# Patient Record
Sex: Female | Born: 1935 | Race: White | Hispanic: No | Smoking: Former smoker
Health system: Southern US, Community
[De-identification: ages and names within clinical notes are randomized; demographics above are authoritative.]

## PROBLEM LIST (undated history)

## (undated) DIAGNOSIS — J45909 Unspecified asthma, uncomplicated: Secondary | ICD-10-CM

## (undated) DIAGNOSIS — K8020 Calculus of gallbladder without cholecystitis without obstruction: Secondary | ICD-10-CM

## (undated) DIAGNOSIS — C569 Malignant neoplasm of unspecified ovary: Secondary | ICD-10-CM

## (undated) DIAGNOSIS — I10 Essential (primary) hypertension: Secondary | ICD-10-CM

## (undated) DIAGNOSIS — I2510 Atherosclerotic heart disease of native coronary artery without angina pectoris: Secondary | ICD-10-CM

## (undated) DIAGNOSIS — E785 Hyperlipidemia, unspecified: Secondary | ICD-10-CM

## (undated) DIAGNOSIS — F17201 Nicotine dependence, unspecified, in remission: Secondary | ICD-10-CM

## (undated) DIAGNOSIS — D649 Anemia, unspecified: Secondary | ICD-10-CM

## (undated) DIAGNOSIS — M797 Fibromyalgia: Secondary | ICD-10-CM

## (undated) DIAGNOSIS — Z8739 Personal history of other diseases of the musculoskeletal system and connective tissue: Secondary | ICD-10-CM

## (undated) DIAGNOSIS — M069 Rheumatoid arthritis, unspecified: Secondary | ICD-10-CM

## (undated) DIAGNOSIS — I4891 Unspecified atrial fibrillation: Secondary | ICD-10-CM

## (undated) DIAGNOSIS — C189 Malignant neoplasm of colon, unspecified: Secondary | ICD-10-CM

## (undated) HISTORY — DX: Hyperlipidemia, unspecified: E78.5

## (undated) HISTORY — DX: Unspecified atrial fibrillation: I48.91

## (undated) HISTORY — PX: OTHER SURGICAL HISTORY: SHX169

## (undated) HISTORY — DX: Malignant neoplasm of unspecified ovary: C56.9

## (undated) HISTORY — DX: Calculus of gallbladder without cholecystitis without obstruction: K80.20

## (undated) HISTORY — PX: TONSILLECTOMY: SUR1361

## (undated) HISTORY — DX: Essential (primary) hypertension: I10

## (undated) HISTORY — DX: Rheumatoid arthritis, unspecified: M06.9

## (undated) HISTORY — DX: Nicotine dependence, unspecified, in remission: F17.201

## (undated) HISTORY — DX: Unspecified asthma, uncomplicated: J45.909

## (undated) HISTORY — DX: Personal history of other diseases of the musculoskeletal system and connective tissue: Z87.39

## (undated) HISTORY — PX: APPENDECTOMY: SHX54

## (undated) HISTORY — DX: Malignant neoplasm of colon, unspecified: C18.9

## (undated) HISTORY — DX: Atherosclerotic heart disease of native coronary artery without angina pectoris: I25.10

---

## 1998-09-19 ENCOUNTER — Ambulatory Visit (HOSPITAL_COMMUNITY): Admission: RE | Admit: 1998-09-19 | Discharge: 1998-09-19 | Payer: Self-pay

## 1999-09-07 HISTORY — PX: PARTIAL COLECTOMY: SHX5273

## 1999-10-09 ENCOUNTER — Ambulatory Visit (HOSPITAL_COMMUNITY): Admission: RE | Admit: 1999-10-09 | Discharge: 1999-10-09 | Payer: Self-pay

## 2000-09-05 ENCOUNTER — Encounter: Admission: RE | Admit: 2000-09-05 | Discharge: 2000-09-05 | Payer: Self-pay | Admitting: General Surgery

## 2000-11-11 ENCOUNTER — Ambulatory Visit (HOSPITAL_COMMUNITY): Admission: RE | Admit: 2000-11-11 | Discharge: 2000-11-11 | Payer: Self-pay | Admitting: Obstetrics & Gynecology

## 2000-11-15 ENCOUNTER — Encounter: Admission: RE | Admit: 2000-11-15 | Discharge: 2000-11-15 | Payer: Self-pay | Admitting: Oncology

## 2000-12-12 ENCOUNTER — Encounter: Admission: RE | Admit: 2000-12-12 | Discharge: 2000-12-12 | Payer: Self-pay | Admitting: Oncology

## 2000-12-12 ENCOUNTER — Encounter (HOSPITAL_COMMUNITY): Admission: RE | Admit: 2000-12-12 | Discharge: 2001-01-11 | Payer: Self-pay | Admitting: Oncology

## 2001-01-11 ENCOUNTER — Encounter (HOSPITAL_COMMUNITY): Admission: RE | Admit: 2001-01-11 | Discharge: 2001-02-10 | Payer: Self-pay | Admitting: Oncology

## 2001-01-11 ENCOUNTER — Encounter: Admission: RE | Admit: 2001-01-11 | Discharge: 2001-01-11 | Payer: Self-pay | Admitting: Oncology

## 2001-02-20 ENCOUNTER — Encounter (HOSPITAL_COMMUNITY): Admission: RE | Admit: 2001-02-20 | Discharge: 2001-03-22 | Payer: Self-pay | Admitting: Oncology

## 2001-02-20 ENCOUNTER — Encounter: Admission: RE | Admit: 2001-02-20 | Discharge: 2001-02-20 | Payer: Self-pay | Admitting: Oncology

## 2001-05-30 ENCOUNTER — Encounter (HOSPITAL_COMMUNITY): Admission: RE | Admit: 2001-05-30 | Discharge: 2001-06-29 | Payer: Self-pay | Admitting: Oncology

## 2001-05-30 ENCOUNTER — Encounter: Admission: RE | Admit: 2001-05-30 | Discharge: 2001-05-30 | Payer: Self-pay | Admitting: Oncology

## 2001-06-12 ENCOUNTER — Ambulatory Visit (HOSPITAL_COMMUNITY): Admission: RE | Admit: 2001-06-12 | Discharge: 2001-06-12 | Payer: Self-pay | Admitting: General Surgery

## 2001-09-06 HISTORY — PX: TOTAL ABDOMINAL HYSTERECTOMY W/ BILATERAL SALPINGOOPHORECTOMY: SHX83

## 2001-09-28 ENCOUNTER — Ambulatory Visit (HOSPITAL_COMMUNITY): Admission: RE | Admit: 2001-09-28 | Discharge: 2001-09-28 | Payer: Self-pay | Admitting: Obstetrics and Gynecology

## 2001-10-16 ENCOUNTER — Inpatient Hospital Stay (HOSPITAL_COMMUNITY): Admission: AD | Admit: 2001-10-16 | Discharge: 2001-10-26 | Payer: Self-pay | Admitting: Obstetrics and Gynecology

## 2001-10-23 ENCOUNTER — Encounter: Payer: Self-pay | Admitting: General Surgery

## 2001-10-31 ENCOUNTER — Other Ambulatory Visit: Admission: RE | Admit: 2001-10-31 | Discharge: 2001-10-31 | Payer: Self-pay | Admitting: Oncology

## 2001-10-31 ENCOUNTER — Encounter (INDEPENDENT_AMBULATORY_CARE_PROVIDER_SITE_OTHER): Payer: Self-pay

## 2001-11-14 ENCOUNTER — Encounter (HOSPITAL_COMMUNITY): Admission: RE | Admit: 2001-11-14 | Discharge: 2001-12-14 | Payer: Self-pay | Admitting: Oncology

## 2001-11-14 ENCOUNTER — Encounter: Admission: RE | Admit: 2001-11-14 | Discharge: 2001-11-14 | Payer: Self-pay | Admitting: Oncology

## 2001-11-16 ENCOUNTER — Encounter (HOSPITAL_COMMUNITY): Payer: Self-pay | Admitting: Oncology

## 2001-12-18 ENCOUNTER — Encounter (HOSPITAL_COMMUNITY): Admission: RE | Admit: 2001-12-18 | Discharge: 2002-01-17 | Payer: Self-pay | Admitting: Oncology

## 2002-01-29 ENCOUNTER — Encounter: Admission: RE | Admit: 2002-01-29 | Discharge: 2002-01-29 | Payer: Self-pay | Admitting: Oncology

## 2002-01-29 ENCOUNTER — Encounter (HOSPITAL_COMMUNITY): Admission: RE | Admit: 2002-01-29 | Discharge: 2002-02-28 | Payer: Self-pay | Admitting: Oncology

## 2002-03-06 ENCOUNTER — Encounter (HOSPITAL_COMMUNITY): Admission: RE | Admit: 2002-03-06 | Discharge: 2002-04-05 | Payer: Self-pay | Admitting: Oncology

## 2002-03-06 ENCOUNTER — Encounter: Admission: RE | Admit: 2002-03-06 | Discharge: 2002-03-06 | Payer: Self-pay | Admitting: Oncology

## 2002-04-06 ENCOUNTER — Encounter: Admission: RE | Admit: 2002-04-06 | Discharge: 2002-04-06 | Payer: Self-pay | Admitting: Oncology

## 2002-04-06 ENCOUNTER — Encounter (HOSPITAL_COMMUNITY): Admission: RE | Admit: 2002-04-06 | Discharge: 2002-05-06 | Payer: Self-pay | Admitting: Oncology

## 2002-04-09 ENCOUNTER — Ambulatory Visit (HOSPITAL_COMMUNITY): Admission: RE | Admit: 2002-04-09 | Discharge: 2002-04-09 | Payer: Self-pay | Admitting: Obstetrics and Gynecology

## 2002-04-09 ENCOUNTER — Encounter: Payer: Self-pay | Admitting: Obstetrics and Gynecology

## 2002-04-11 ENCOUNTER — Encounter (HOSPITAL_COMMUNITY): Payer: Self-pay | Admitting: Oncology

## 2002-05-16 ENCOUNTER — Encounter (HOSPITAL_COMMUNITY): Admission: RE | Admit: 2002-05-16 | Discharge: 2002-06-15 | Payer: Self-pay | Admitting: Oncology

## 2002-05-16 ENCOUNTER — Encounter: Admission: RE | Admit: 2002-05-16 | Discharge: 2002-05-16 | Payer: Self-pay | Admitting: Oncology

## 2002-06-04 ENCOUNTER — Encounter: Payer: Self-pay | Admitting: Family Medicine

## 2002-06-04 ENCOUNTER — Ambulatory Visit (HOSPITAL_COMMUNITY): Admission: RE | Admit: 2002-06-04 | Discharge: 2002-06-04 | Payer: Self-pay | Admitting: Family Medicine

## 2002-07-11 ENCOUNTER — Encounter: Admission: RE | Admit: 2002-07-11 | Discharge: 2002-07-11 | Payer: Self-pay | Admitting: Oncology

## 2002-08-23 ENCOUNTER — Encounter: Admission: RE | Admit: 2002-08-23 | Discharge: 2002-08-23 | Payer: Self-pay | Admitting: Oncology

## 2002-10-05 ENCOUNTER — Encounter: Admission: RE | Admit: 2002-10-05 | Discharge: 2002-10-05 | Payer: Self-pay | Admitting: Oncology

## 2002-10-05 ENCOUNTER — Encounter (HOSPITAL_COMMUNITY): Admission: RE | Admit: 2002-10-05 | Discharge: 2002-11-04 | Payer: Self-pay | Admitting: Oncology

## 2002-11-07 ENCOUNTER — Ambulatory Visit (HOSPITAL_COMMUNITY): Admission: RE | Admit: 2002-11-07 | Discharge: 2002-11-07 | Payer: Self-pay | Admitting: *Deleted

## 2002-12-31 ENCOUNTER — Encounter: Admission: RE | Admit: 2002-12-31 | Discharge: 2002-12-31 | Payer: Self-pay | Admitting: Oncology

## 2003-02-15 ENCOUNTER — Encounter: Admission: RE | Admit: 2003-02-15 | Discharge: 2003-02-15 | Payer: Self-pay | Admitting: Oncology

## 2003-02-15 ENCOUNTER — Encounter (HOSPITAL_COMMUNITY): Admission: RE | Admit: 2003-02-15 | Discharge: 2003-03-17 | Payer: Self-pay | Admitting: Oncology

## 2003-05-21 ENCOUNTER — Encounter: Admission: RE | Admit: 2003-05-21 | Discharge: 2003-05-21 | Payer: Self-pay | Admitting: Oncology

## 2003-05-21 ENCOUNTER — Encounter (HOSPITAL_COMMUNITY): Admission: RE | Admit: 2003-05-21 | Discharge: 2003-06-20 | Payer: Self-pay | Admitting: Oncology

## 2003-07-02 ENCOUNTER — Encounter: Admission: RE | Admit: 2003-07-02 | Discharge: 2003-07-02 | Payer: Self-pay | Admitting: Oncology

## 2003-08-16 ENCOUNTER — Ambulatory Visit (HOSPITAL_COMMUNITY): Admission: RE | Admit: 2003-08-16 | Discharge: 2003-08-16 | Payer: Self-pay | Admitting: Family Medicine

## 2003-08-19 ENCOUNTER — Encounter: Admission: RE | Admit: 2003-08-19 | Discharge: 2003-08-19 | Payer: Self-pay | Admitting: Oncology

## 2003-10-14 ENCOUNTER — Encounter (HOSPITAL_COMMUNITY): Admission: RE | Admit: 2003-10-14 | Discharge: 2003-11-13 | Payer: Self-pay | Admitting: Oncology

## 2003-10-14 ENCOUNTER — Encounter: Admission: RE | Admit: 2003-10-14 | Discharge: 2003-10-14 | Payer: Self-pay | Admitting: Oncology

## 2003-11-06 ENCOUNTER — Ambulatory Visit (HOSPITAL_COMMUNITY): Admission: RE | Admit: 2003-11-06 | Discharge: 2003-11-06 | Payer: Self-pay | Admitting: General Surgery

## 2003-11-11 ENCOUNTER — Inpatient Hospital Stay (HOSPITAL_COMMUNITY): Admission: RE | Admit: 2003-11-11 | Discharge: 2003-11-16 | Payer: Self-pay | Admitting: General Surgery

## 2003-12-03 ENCOUNTER — Encounter: Admission: RE | Admit: 2003-12-03 | Discharge: 2003-12-03 | Payer: Self-pay | Admitting: Oncology

## 2004-05-07 ENCOUNTER — Encounter (HOSPITAL_COMMUNITY): Admission: RE | Admit: 2004-05-07 | Discharge: 2004-06-05 | Payer: Self-pay | Admitting: Oncology

## 2004-05-07 ENCOUNTER — Encounter: Admission: RE | Admit: 2004-05-07 | Discharge: 2004-06-05 | Payer: Self-pay | Admitting: Oncology

## 2004-05-22 ENCOUNTER — Encounter (HOSPITAL_COMMUNITY): Admission: RE | Admit: 2004-05-22 | Discharge: 2004-06-03 | Payer: Self-pay | Admitting: Oncology

## 2004-06-09 ENCOUNTER — Encounter: Admission: RE | Admit: 2004-06-09 | Discharge: 2004-06-09 | Payer: Self-pay | Admitting: Oncology

## 2004-06-09 ENCOUNTER — Encounter (HOSPITAL_COMMUNITY): Admission: RE | Admit: 2004-06-09 | Discharge: 2004-07-09 | Payer: Self-pay | Admitting: Oncology

## 2004-07-27 ENCOUNTER — Encounter: Admission: RE | Admit: 2004-07-27 | Discharge: 2004-07-27 | Payer: Self-pay | Admitting: Oncology

## 2004-07-27 ENCOUNTER — Encounter (HOSPITAL_COMMUNITY): Admission: RE | Admit: 2004-07-27 | Discharge: 2004-08-26 | Payer: Self-pay | Admitting: Oncology

## 2004-07-28 ENCOUNTER — Ambulatory Visit (HOSPITAL_COMMUNITY): Payer: Self-pay | Admitting: Oncology

## 2004-09-07 ENCOUNTER — Encounter: Admission: RE | Admit: 2004-09-07 | Discharge: 2004-09-07 | Payer: Self-pay | Admitting: Oncology

## 2004-09-07 ENCOUNTER — Encounter (HOSPITAL_COMMUNITY): Admission: RE | Admit: 2004-09-07 | Discharge: 2004-10-07 | Payer: Self-pay | Admitting: Oncology

## 2004-09-16 ENCOUNTER — Ambulatory Visit (HOSPITAL_COMMUNITY): Payer: Self-pay | Admitting: Oncology

## 2004-10-13 ENCOUNTER — Encounter: Admission: RE | Admit: 2004-10-13 | Discharge: 2004-10-13 | Payer: Self-pay | Admitting: Oncology

## 2004-10-13 ENCOUNTER — Encounter (HOSPITAL_COMMUNITY): Admission: RE | Admit: 2004-10-13 | Discharge: 2004-11-12 | Payer: Self-pay | Admitting: Oncology

## 2004-10-26 ENCOUNTER — Ambulatory Visit (HOSPITAL_COMMUNITY): Admission: RE | Admit: 2004-10-26 | Discharge: 2004-10-26 | Payer: Self-pay | Admitting: Family Medicine

## 2004-11-03 ENCOUNTER — Ambulatory Visit (HOSPITAL_COMMUNITY): Payer: Self-pay | Admitting: Oncology

## 2004-12-15 ENCOUNTER — Encounter: Admission: RE | Admit: 2004-12-15 | Discharge: 2004-12-15 | Payer: Self-pay | Admitting: Oncology

## 2004-12-15 ENCOUNTER — Encounter (HOSPITAL_COMMUNITY): Admission: RE | Admit: 2004-12-15 | Discharge: 2005-01-14 | Payer: Self-pay | Admitting: Oncology

## 2005-01-12 ENCOUNTER — Ambulatory Visit (HOSPITAL_COMMUNITY): Payer: Self-pay | Admitting: Oncology

## 2005-01-29 ENCOUNTER — Encounter: Admission: RE | Admit: 2005-01-29 | Discharge: 2005-01-29 | Payer: Self-pay | Admitting: Oncology

## 2005-01-29 ENCOUNTER — Encounter (HOSPITAL_COMMUNITY): Admission: RE | Admit: 2005-01-29 | Discharge: 2005-02-28 | Payer: Self-pay | Admitting: Oncology

## 2005-01-31 ENCOUNTER — Emergency Department (HOSPITAL_COMMUNITY): Admission: EM | Admit: 2005-01-31 | Discharge: 2005-01-31 | Payer: Self-pay | Admitting: Emergency Medicine

## 2005-02-04 ENCOUNTER — Inpatient Hospital Stay (HOSPITAL_COMMUNITY): Admission: AD | Admit: 2005-02-04 | Discharge: 2005-02-06 | Payer: Self-pay | Admitting: Family Medicine

## 2005-03-12 ENCOUNTER — Ambulatory Visit (HOSPITAL_COMMUNITY): Payer: Self-pay | Admitting: Oncology

## 2005-03-12 ENCOUNTER — Encounter: Admission: RE | Admit: 2005-03-12 | Discharge: 2005-03-12 | Payer: Self-pay | Admitting: Oncology

## 2005-04-12 ENCOUNTER — Encounter (HOSPITAL_COMMUNITY): Admission: RE | Admit: 2005-04-12 | Discharge: 2005-05-12 | Payer: Self-pay | Admitting: Oncology

## 2005-05-31 ENCOUNTER — Ambulatory Visit (HOSPITAL_COMMUNITY): Payer: Self-pay | Admitting: Oncology

## 2005-05-31 ENCOUNTER — Encounter: Admission: RE | Admit: 2005-05-31 | Discharge: 2005-06-04 | Payer: Self-pay | Admitting: Oncology

## 2005-07-15 ENCOUNTER — Encounter (HOSPITAL_COMMUNITY): Admission: RE | Admit: 2005-07-15 | Discharge: 2005-08-14 | Payer: Self-pay | Admitting: Oncology

## 2005-07-15 ENCOUNTER — Encounter: Admission: RE | Admit: 2005-07-15 | Discharge: 2005-07-15 | Payer: Self-pay | Admitting: Oncology

## 2005-08-09 ENCOUNTER — Ambulatory Visit (HOSPITAL_COMMUNITY): Payer: Self-pay | Admitting: Oncology

## 2005-08-26 ENCOUNTER — Encounter: Admission: RE | Admit: 2005-08-26 | Discharge: 2005-08-26 | Payer: Self-pay | Admitting: Oncology

## 2005-10-06 ENCOUNTER — Ambulatory Visit (HOSPITAL_COMMUNITY): Payer: Self-pay | Admitting: Oncology

## 2005-10-06 ENCOUNTER — Encounter: Admission: RE | Admit: 2005-10-06 | Discharge: 2005-10-06 | Payer: Self-pay | Admitting: Oncology

## 2005-11-12 ENCOUNTER — Encounter (HOSPITAL_COMMUNITY): Admission: RE | Admit: 2005-11-12 | Discharge: 2005-12-12 | Payer: Self-pay | Admitting: Oncology

## 2005-11-12 ENCOUNTER — Encounter: Admission: RE | Admit: 2005-11-12 | Discharge: 2005-11-12 | Payer: Self-pay | Admitting: Oncology

## 2005-12-24 ENCOUNTER — Ambulatory Visit (HOSPITAL_COMMUNITY): Payer: Self-pay | Admitting: Oncology

## 2005-12-24 ENCOUNTER — Encounter: Admission: RE | Admit: 2005-12-24 | Discharge: 2005-12-24 | Payer: Self-pay | Admitting: Oncology

## 2006-02-04 ENCOUNTER — Encounter (HOSPITAL_COMMUNITY): Admission: RE | Admit: 2006-02-04 | Discharge: 2006-03-06 | Payer: Self-pay | Admitting: Oncology

## 2006-02-04 ENCOUNTER — Encounter: Admission: RE | Admit: 2006-02-04 | Discharge: 2006-02-04 | Payer: Self-pay | Admitting: Oncology

## 2006-03-07 ENCOUNTER — Ambulatory Visit (HOSPITAL_COMMUNITY): Payer: Self-pay | Admitting: Oncology

## 2006-03-23 ENCOUNTER — Encounter: Admission: RE | Admit: 2006-03-23 | Discharge: 2006-03-23 | Payer: Self-pay | Admitting: Oncology

## 2006-03-23 ENCOUNTER — Encounter (HOSPITAL_COMMUNITY): Admission: RE | Admit: 2006-03-23 | Discharge: 2006-04-22 | Payer: Self-pay | Admitting: Oncology

## 2006-04-22 ENCOUNTER — Ambulatory Visit (HOSPITAL_COMMUNITY): Admission: RE | Admit: 2006-04-22 | Discharge: 2006-04-22 | Payer: Self-pay | Admitting: Internal Medicine

## 2006-04-22 ENCOUNTER — Encounter (INDEPENDENT_AMBULATORY_CARE_PROVIDER_SITE_OTHER): Payer: Self-pay | Admitting: Specialist

## 2006-04-22 ENCOUNTER — Ambulatory Visit: Payer: Self-pay | Admitting: Internal Medicine

## 2006-05-05 ENCOUNTER — Encounter (HOSPITAL_COMMUNITY): Admission: RE | Admit: 2006-05-05 | Discharge: 2006-06-03 | Payer: Self-pay | Admitting: Oncology

## 2006-05-05 ENCOUNTER — Ambulatory Visit (HOSPITAL_COMMUNITY): Payer: Self-pay | Admitting: Oncology

## 2006-05-05 ENCOUNTER — Encounter: Admission: RE | Admit: 2006-05-05 | Discharge: 2006-06-03 | Payer: Self-pay | Admitting: Oncology

## 2006-06-16 ENCOUNTER — Encounter: Admission: RE | Admit: 2006-06-16 | Discharge: 2006-06-16 | Payer: Self-pay | Admitting: Oncology

## 2006-08-02 ENCOUNTER — Ambulatory Visit (HOSPITAL_COMMUNITY): Payer: Self-pay | Admitting: Oncology

## 2006-09-13 ENCOUNTER — Encounter (HOSPITAL_COMMUNITY): Admission: RE | Admit: 2006-09-13 | Discharge: 2006-10-13 | Payer: Self-pay | Admitting: Oncology

## 2006-10-25 ENCOUNTER — Encounter (HOSPITAL_COMMUNITY): Admission: RE | Admit: 2006-10-25 | Discharge: 2006-11-24 | Payer: Self-pay | Admitting: Oncology

## 2006-10-25 ENCOUNTER — Ambulatory Visit (HOSPITAL_COMMUNITY): Payer: Self-pay | Admitting: Oncology

## 2007-01-24 ENCOUNTER — Ambulatory Visit (HOSPITAL_COMMUNITY): Payer: Self-pay | Admitting: Oncology

## 2007-03-29 ENCOUNTER — Ambulatory Visit (HOSPITAL_COMMUNITY): Payer: Self-pay | Admitting: Oncology

## 2007-04-20 ENCOUNTER — Ambulatory Visit (HOSPITAL_COMMUNITY): Admission: RE | Admit: 2007-04-20 | Discharge: 2007-04-20 | Payer: Self-pay | Admitting: Family Medicine

## 2007-06-28 ENCOUNTER — Ambulatory Visit (HOSPITAL_COMMUNITY): Payer: Self-pay | Admitting: Oncology

## 2007-06-28 ENCOUNTER — Encounter (HOSPITAL_COMMUNITY): Admission: RE | Admit: 2007-06-28 | Discharge: 2007-07-28 | Payer: Self-pay | Admitting: Oncology

## 2007-10-04 ENCOUNTER — Ambulatory Visit (HOSPITAL_COMMUNITY): Payer: Self-pay | Admitting: Oncology

## 2007-12-27 ENCOUNTER — Ambulatory Visit (HOSPITAL_COMMUNITY): Payer: Self-pay | Admitting: Oncology

## 2008-01-05 ENCOUNTER — Ambulatory Visit: Payer: Self-pay | Admitting: Cardiology

## 2008-01-05 ENCOUNTER — Inpatient Hospital Stay (HOSPITAL_COMMUNITY): Admission: AD | Admit: 2008-01-05 | Discharge: 2008-01-06 | Payer: Self-pay | Admitting: Cardiology

## 2008-01-05 ENCOUNTER — Encounter: Payer: Self-pay | Admitting: Emergency Medicine

## 2008-01-11 ENCOUNTER — Ambulatory Visit: Payer: Self-pay

## 2008-02-13 ENCOUNTER — Ambulatory Visit (HOSPITAL_COMMUNITY): Payer: Self-pay | Admitting: Oncology

## 2008-03-06 ENCOUNTER — Other Ambulatory Visit (HOSPITAL_COMMUNITY): Payer: Self-pay | Admitting: Oncology

## 2008-03-21 ENCOUNTER — Encounter (HOSPITAL_COMMUNITY): Admission: RE | Admit: 2008-03-21 | Discharge: 2008-04-20 | Payer: Self-pay | Admitting: Oncology

## 2008-04-17 ENCOUNTER — Ambulatory Visit (HOSPITAL_COMMUNITY): Payer: Self-pay | Admitting: Oncology

## 2008-09-04 ENCOUNTER — Ambulatory Visit (HOSPITAL_COMMUNITY): Payer: Self-pay | Admitting: Oncology

## 2008-09-04 ENCOUNTER — Encounter (HOSPITAL_COMMUNITY): Admission: RE | Admit: 2008-09-04 | Discharge: 2008-10-04 | Payer: Self-pay | Admitting: Oncology

## 2008-12-26 ENCOUNTER — Ambulatory Visit (HOSPITAL_COMMUNITY): Payer: Self-pay | Admitting: Oncology

## 2009-02-18 ENCOUNTER — Ambulatory Visit (HOSPITAL_COMMUNITY): Payer: Self-pay | Admitting: Oncology

## 2009-04-24 ENCOUNTER — Ambulatory Visit (HOSPITAL_COMMUNITY): Payer: Self-pay | Admitting: Oncology

## 2009-04-24 ENCOUNTER — Encounter (HOSPITAL_COMMUNITY): Admission: RE | Admit: 2009-04-24 | Discharge: 2009-05-24 | Payer: Self-pay | Admitting: Oncology

## 2009-05-09 ENCOUNTER — Other Ambulatory Visit (HOSPITAL_COMMUNITY): Payer: Self-pay | Admitting: Oncology

## 2009-07-17 ENCOUNTER — Ambulatory Visit (HOSPITAL_COMMUNITY): Payer: Self-pay | Admitting: Oncology

## 2009-08-26 ENCOUNTER — Emergency Department (HOSPITAL_COMMUNITY): Admission: EM | Admit: 2009-08-26 | Discharge: 2009-08-26 | Payer: Self-pay | Admitting: Emergency Medicine

## 2009-09-12 ENCOUNTER — Ambulatory Visit (HOSPITAL_COMMUNITY): Admission: RE | Admit: 2009-09-12 | Discharge: 2009-09-12 | Payer: Self-pay | Admitting: Family Medicine

## 2009-10-08 ENCOUNTER — Ambulatory Visit (HOSPITAL_COMMUNITY): Payer: Self-pay | Admitting: Oncology

## 2010-01-19 ENCOUNTER — Ambulatory Visit (HOSPITAL_COMMUNITY): Payer: Self-pay | Admitting: Oncology

## 2010-03-02 ENCOUNTER — Other Ambulatory Visit (HOSPITAL_COMMUNITY): Payer: Self-pay | Admitting: Oncology

## 2010-03-02 ENCOUNTER — Encounter (HOSPITAL_COMMUNITY): Admission: RE | Admit: 2010-03-02 | Discharge: 2010-04-01 | Payer: Self-pay | Admitting: Oncology

## 2010-04-08 ENCOUNTER — Encounter (HOSPITAL_COMMUNITY): Admission: RE | Admit: 2010-04-08 | Discharge: 2010-05-08 | Payer: Self-pay | Admitting: Oncology

## 2010-04-08 ENCOUNTER — Ambulatory Visit (HOSPITAL_COMMUNITY): Payer: Self-pay | Admitting: Oncology

## 2010-04-21 ENCOUNTER — Encounter (HOSPITAL_COMMUNITY): Payer: Self-pay | Admitting: Oncology

## 2010-04-21 ENCOUNTER — Ambulatory Visit: Payer: Self-pay | Admitting: Cardiology

## 2010-05-15 ENCOUNTER — Encounter (HOSPITAL_COMMUNITY)
Admission: RE | Admit: 2010-05-15 | Discharge: 2010-06-05 | Payer: Self-pay | Source: Home / Self Care | Admitting: Oncology

## 2010-06-17 ENCOUNTER — Ambulatory Visit (HOSPITAL_COMMUNITY): Payer: Self-pay | Admitting: Oncology

## 2010-06-17 ENCOUNTER — Encounter (HOSPITAL_COMMUNITY)
Admission: RE | Admit: 2010-06-17 | Discharge: 2010-07-17 | Payer: Self-pay | Source: Home / Self Care | Admitting: Oncology

## 2010-08-06 HISTORY — PX: BLADDER SURGERY: SHX569

## 2010-08-12 ENCOUNTER — Ambulatory Visit (HOSPITAL_COMMUNITY): Payer: Self-pay | Admitting: Oncology

## 2010-08-12 ENCOUNTER — Encounter (HOSPITAL_COMMUNITY)
Admission: RE | Admit: 2010-08-12 | Discharge: 2010-09-11 | Payer: Self-pay | Source: Home / Self Care | Attending: Oncology | Admitting: Oncology

## 2010-09-25 ENCOUNTER — Encounter (HOSPITAL_COMMUNITY)
Admission: RE | Admit: 2010-09-25 | Discharge: 2010-10-06 | Payer: Self-pay | Source: Home / Self Care | Attending: Oncology | Admitting: Oncology

## 2010-09-26 ENCOUNTER — Encounter (HOSPITAL_COMMUNITY): Payer: Self-pay | Admitting: Oncology

## 2010-09-28 LAB — COMPREHENSIVE METABOLIC PANEL
ALT: 14 U/L (ref 0–35)
AST: 15 U/L (ref 0–37)
CO2: 29 mEq/L (ref 19–32)
Calcium: 9.2 mg/dL (ref 8.4–10.5)
GFR calc Af Amer: >60 mL/min (ref >60)
GFR calc non Af Amer: >60 mL/min (ref >60)
Sodium: 143 mEq/L (ref 135–145)

## 2010-11-04 ENCOUNTER — Other Ambulatory Visit (HOSPITAL_COMMUNITY): Payer: Self-pay | Admitting: Oncology

## 2010-11-04 ENCOUNTER — Encounter (HOSPITAL_COMMUNITY): Payer: Medicare Other | Attending: Oncology

## 2010-11-04 ENCOUNTER — Ambulatory Visit (HOSPITAL_COMMUNITY): Payer: Medicare Other | Admitting: Oncology

## 2010-11-04 DIAGNOSIS — C189 Malignant neoplasm of colon, unspecified: Secondary | ICD-10-CM

## 2010-11-04 DIAGNOSIS — C50919 Malignant neoplasm of unspecified site of unspecified female breast: Secondary | ICD-10-CM | POA: Insufficient documentation

## 2010-11-04 DIAGNOSIS — Z9221 Personal history of antineoplastic chemotherapy: Secondary | ICD-10-CM | POA: Insufficient documentation

## 2010-11-04 DIAGNOSIS — Z85038 Personal history of other malignant neoplasm of large intestine: Secondary | ICD-10-CM | POA: Insufficient documentation

## 2010-11-04 DIAGNOSIS — C801 Malignant (primary) neoplasm, unspecified: Secondary | ICD-10-CM

## 2010-11-04 DIAGNOSIS — Z79899 Other long term (current) drug therapy: Secondary | ICD-10-CM | POA: Insufficient documentation

## 2010-11-04 DIAGNOSIS — C779 Secondary and unspecified malignant neoplasm of lymph node, unspecified: Secondary | ICD-10-CM | POA: Insufficient documentation

## 2010-11-12 ENCOUNTER — Encounter (HOSPITAL_COMMUNITY)
Admission: RE | Admit: 2010-11-12 | Discharge: 2010-11-12 | Disposition: A | Discharge status: Home / Self Care | Payer: Medicare Other | Source: Ambulatory Visit | Attending: Oncology | Admitting: Oncology

## 2010-11-12 ENCOUNTER — Encounter (HOSPITAL_COMMUNITY): Payer: Self-pay

## 2010-11-12 DIAGNOSIS — C189 Malignant neoplasm of colon, unspecified: Secondary | ICD-10-CM | POA: Insufficient documentation

## 2010-11-12 DIAGNOSIS — Z8543 Personal history of malignant neoplasm of ovary: Secondary | ICD-10-CM | POA: Insufficient documentation

## 2010-11-12 DIAGNOSIS — C679 Malignant neoplasm of bladder, unspecified: Secondary | ICD-10-CM | POA: Insufficient documentation

## 2010-11-12 MED ORDER — FLUDEOXYGLUCOSE F - 18 (FDG) INJECTION
17.1000 | Freq: Once | INTRAVENOUS | Status: AC | PRN
  Administered 2010-11-12: 17.1 via INTRAVENOUS

## 2010-11-13 ENCOUNTER — Ambulatory Visit (HOSPITAL_COMMUNITY): Payer: Medicare Other | Admitting: Oncology

## 2010-11-13 DIAGNOSIS — C189 Malignant neoplasm of colon, unspecified: Secondary | ICD-10-CM

## 2010-11-18 LAB — DIFFERENTIAL
Eosinophils Absolute: 0.1 10*3/uL (ref 0.0–0.7)
Lymphs Abs: 1 10*3/uL (ref 0.7–4.0)
Monocytes Absolute: 0.3 10*3/uL (ref 0.1–1.0)
Monocytes Relative: 9 % (ref 3–12)
Neutro Abs: 2.1 10*3/uL (ref 1.7–7.7)
Neutrophils Relative %: 59 % (ref 43–77)

## 2010-11-18 LAB — CBC
HCT: 38.8 % (ref 36.0–46.0)
Hemoglobin: 13.2 g/dL (ref 12.0–15.0)
MCH: 29 pg (ref 26.0–34.0)
MCHC: 34.2 g/dL (ref 30.0–36.0)
RBC: 4.56 MIL/uL (ref 3.87–5.11)

## 2010-11-19 LAB — CBC
Hemoglobin: 13.6 g/dL (ref 12.0–15.0)
MCH: 28.8 pg (ref 26.0–34.0)
MCHC: 34 g/dL (ref 30.0–36.0)
MCV: 84.6 fL (ref 78.0–100.0)
RBC: 4.71 MIL/uL (ref 3.87–5.11)

## 2010-11-19 LAB — DIFFERENTIAL
Basophils Relative: 1 % (ref 0–1)
Eosinophils Absolute: 0.1 10*3/uL (ref 0.0–0.7)
Eosinophils Relative: 4 % (ref 0–5)
Lymphs Abs: 1.4 10*3/uL (ref 0.7–4.0)
Monocytes Absolute: 0.4 10*3/uL (ref 0.1–1.0)
Monocytes Relative: 10 % (ref 3–12)

## 2010-11-19 LAB — URINE CULTURE: Colony Count: >=100000

## 2010-11-22 LAB — COMPREHENSIVE METABOLIC PANEL
AST: 21 U/L (ref 0–37)
Albumin: 4.2 g/dL (ref 3.5–5.2)
Alkaline Phosphatase: 58 U/L (ref 39–117)
BUN: 12 mg/dL (ref 6–23)
GFR calc Af Amer: >60 mL/min (ref >60)
Potassium: 3.5 mEq/L (ref 3.5–5.1)
Sodium: 138 mEq/L (ref 135–145)
Total Protein: 7 g/dL (ref 6.0–8.3)

## 2010-11-22 LAB — DIFFERENTIAL
Basophils Relative: 0 % (ref 0–1)
Monocytes Absolute: 0.3 10*3/uL (ref 0.1–1.0)
Monocytes Relative: 4 % (ref 3–12)
Neutro Abs: 4.9 10*3/uL (ref 1.7–7.7)

## 2010-11-22 LAB — CBC
MCH: 28.9 pg (ref 26.0–34.0)
Platelets: 174 10*3/uL (ref 150–400)
RBC: 4.92 MIL/uL (ref 3.87–5.11)
WBC: 6.8 10*3/uL (ref 4.0–10.5)

## 2010-11-22 LAB — CEA: CEA: 2.6 ng/mL (ref 0.0–5.0)

## 2010-11-25 ENCOUNTER — Other Ambulatory Visit (HOSPITAL_COMMUNITY): Payer: Self-pay | Admitting: Oncology

## 2010-11-25 DIAGNOSIS — C189 Malignant neoplasm of colon, unspecified: Secondary | ICD-10-CM

## 2010-12-07 ENCOUNTER — Encounter (HOSPITAL_COMMUNITY): Payer: Medicare Other | Attending: Oncology | Admitting: Oncology

## 2010-12-07 DIAGNOSIS — C801 Malignant (primary) neoplasm, unspecified: Secondary | ICD-10-CM

## 2010-12-07 DIAGNOSIS — Z85038 Personal history of other malignant neoplasm of large intestine: Secondary | ICD-10-CM | POA: Insufficient documentation

## 2010-12-07 DIAGNOSIS — C50919 Malignant neoplasm of unspecified site of unspecified female breast: Secondary | ICD-10-CM | POA: Insufficient documentation

## 2010-12-07 DIAGNOSIS — C189 Malignant neoplasm of colon, unspecified: Secondary | ICD-10-CM

## 2010-12-07 DIAGNOSIS — Z79899 Other long term (current) drug therapy: Secondary | ICD-10-CM | POA: Insufficient documentation

## 2010-12-11 LAB — CEA: CEA: 2.6 ng/mL (ref 0.0–5.0)

## 2010-12-15 ENCOUNTER — Encounter (HOSPITAL_COMMUNITY): Payer: Medicare Other

## 2010-12-15 DIAGNOSIS — C189 Malignant neoplasm of colon, unspecified: Secondary | ICD-10-CM

## 2011-01-04 ENCOUNTER — Encounter (HOSPITAL_COMMUNITY): Payer: Medicare Other

## 2011-01-05 ENCOUNTER — Encounter (HOSPITAL_COMMUNITY): Payer: Medicare Other | Attending: Oncology

## 2011-01-05 DIAGNOSIS — C779 Secondary and unspecified malignant neoplasm of lymph node, unspecified: Secondary | ICD-10-CM | POA: Insufficient documentation

## 2011-01-05 DIAGNOSIS — C50919 Malignant neoplasm of unspecified site of unspecified female breast: Secondary | ICD-10-CM | POA: Insufficient documentation

## 2011-01-05 DIAGNOSIS — Z85038 Personal history of other malignant neoplasm of large intestine: Secondary | ICD-10-CM | POA: Insufficient documentation

## 2011-01-05 DIAGNOSIS — Z79899 Other long term (current) drug therapy: Secondary | ICD-10-CM | POA: Insufficient documentation

## 2011-01-11 ENCOUNTER — Other Ambulatory Visit (HOSPITAL_COMMUNITY): Payer: Self-pay | Admitting: Oncology

## 2011-01-11 ENCOUNTER — Encounter (HOSPITAL_COMMUNITY): Payer: Medicare Other | Attending: Oncology

## 2011-01-11 DIAGNOSIS — C779 Secondary and unspecified malignant neoplasm of lymph node, unspecified: Secondary | ICD-10-CM | POA: Insufficient documentation

## 2011-01-11 DIAGNOSIS — Z85038 Personal history of other malignant neoplasm of large intestine: Secondary | ICD-10-CM | POA: Insufficient documentation

## 2011-01-11 DIAGNOSIS — Z79899 Other long term (current) drug therapy: Secondary | ICD-10-CM | POA: Insufficient documentation

## 2011-01-11 DIAGNOSIS — C50919 Malignant neoplasm of unspecified site of unspecified female breast: Secondary | ICD-10-CM | POA: Insufficient documentation

## 2011-01-11 DIAGNOSIS — C189 Malignant neoplasm of colon, unspecified: Secondary | ICD-10-CM

## 2011-01-11 LAB — CBC
HCT: 42.4 % (ref 36.0–46.0)
MCV: 89.6 fL (ref 78.0–100.0)
RBC: 4.73 MIL/uL (ref 3.87–5.11)
WBC: 6.5 10*3/uL (ref 4.0–10.5)

## 2011-01-11 LAB — DIFFERENTIAL
Eosinophils Relative: 1 % (ref 0–5)
Lymphocytes Relative: 25 % (ref 12–46)
Lymphs Abs: 1.7 10*3/uL (ref 0.7–4.0)
Neutrophils Relative %: 66 % (ref 43–77)

## 2011-01-11 LAB — COMPREHENSIVE METABOLIC PANEL
Alkaline Phosphatase: 65 U/L (ref 39–117)
BUN: 15 mg/dL (ref 6–23)
Chloride: 101 mEq/L (ref 96–112)
Glucose, Bld: 80 mg/dL (ref 70–99)
Potassium: 4.1 mEq/L (ref 3.5–5.1)
Total Bilirubin: 1.9 mg/dL — ABNORMAL HIGH (ref 0.3–1.2)

## 2011-01-19 NOTE — Discharge Summary (Signed)
NAMEDIANNE, WHELCHEL               ACCOUNT NO.:  [id number]   MEDICAL RECORD NO.:  [id number]          PATIENT TYPE:  INP   LOCATION:  3740                         FACILITY:  [facility]   PHYSICIAN:  Bevelyn Buckles. Bensimhon, MDDATE OF BIRTH:  1933/06/17   DATE OF ADMISSION:  01/05/2008  DATE OF DISCHARGE:  01/06/2008                         DISCHARGE SUMMARY - REFERRING   DISCHARGE DIAGNOSES:  1. Prolonged atypical discomfort of uncertain etiology, probable      musculoskeletal.  2. Hyperlipidemia with elevated triglycerides.  History is noted per      past medical history.   BRIEF HISTORY:  Ms. Abbruzzese is a 77 year old female who approximately two  nights prior while lying down for bed developed left upper chest,  shoulder and scapular discomfort without any associated symptoms.  It  improved somewhat with ibuprofen, however, while driving on the day  prior to admission and again on the evening prior to admission developed  recurring symptoms that were slightly worse.  She presented to the  emergency room for further evaluation as the discomfort bothered her  while walking.  After being seen at [facility] ER she was transferred to  [facility] for further evaluation.   PAST MEDICAL HISTORY:  Is notable for metastatic colon cancer, ovarian  cancer, status post hysterectomy, chemotherapy, subtotal colectomy,  polymyalgia rheumatica, rheumatoid arthritis, asthma and pneumothorax in  the 60s.  Remote tobacco use.   LABORATORY:  Chest x-ray on May 1 showed no acute cardiopulmonary  abnormality.  CT scan did not show any evidence of pulmonary embolus or  acute abnormality.  Admission weight was 57.1 kg.  Admission H and H was  14.1 and 41.0, normal indices, platelets 217, WBCs 5.7, PT 13.  On  admission sodium 141, potassium 3.7, BUN 14, creatinine 0.52, glucose  93.  On the 2nd LFTs were within normal limits except slight elevation  of bilirubin at 1.6.  CK MBs, relative indexes and troponins  were within  normal limits x2.  Fasting lipids on the 2nd showed a total cholesterol  of 170, triglycerides 189, HDL 41, LDL 91.  TSH was within normal limits  at 2.875.  EKG showed normal sinus rhythm, left axis deviation, delayed  R-waves, nonspecific ST-T wave changes, PVC.   HOSPITAL COURSE:  She was transferred to 3700 and was admitted to [facility] by Ward Givens, nurse practitioner and Dr. Juanito Doom.  It  was felt that her discomfort was very atypical, more related to  musculoskeletal.  Dr. Daleen Squibb kept her overnight for observation and felt  that if she ruled out she could be discharged home with outpatient  stress test.  She was placed on nonsteroidal.  She continued to have  some shoulder discomfort.  Associated EKGs did not show any acute  changes.  By the morning of the 2nd she had ruled out for myocardial  infarction and Dr. Andee Lineman on review also felt her discomfort was very  atypical and more musculoskeletal than cardiac in nature.  He agreed  that she could be discharged home.   DISPOSITION:  The patient is discharged home.  She is asked to maintain  low-sodium heart-healthy diet.  Activities were not restricted.  Wound  care is not applicable.  She was asked to take over-the-counter  ibuprofen 200 mg 3 tablets every 6 hours for 5 days.  Our office will  call her with arrangements for a stress Myoview and a followup  appointment with Dr. Daleen Squibb.  The stress Myoview and the followup  appointment could either be done in  [place] or [place].  I advised her to bring all medications to all  appointments.  If she has continued worsening discomfort she may always  report back to the emergency room for further evaluation.  We sent the  information to the [facility] office and she will be called with these  arrangements.  Discharge time 30 minutes.      Joellyn Rued, PA-C      Bevelyn Buckles. Bensimhon, MD  Electronically Signed    EW/MEDQ  D:  01/06/2008  T:   01/06/2008  Job:  [id number]

## 2011-01-19 NOTE — H&P (Signed)
[name], [name]               ACCOUNT NO.:  [id number]   MEDICAL RECORD NO.:  [id number]          PATIENT TYPE:  INP   LOCATION:  3740                         FACILITY:  [facility]   PHYSICIAN:  Jesse Sans. Wall, MD, FACCDATE OF BIRTH:  Jan 14, 1933   DATE OF ADMISSION:  01/05/2008  DATE OF DISCHARGE:                              HISTORY & PHYSICAL   PRIMARY CARDIOLOGIST:  [facility], seeing Dr. Daleen Squibb.   PRIMARY CARE Ople Girgis:  Dr. Gerda Diss.   PATIENT PROFILE:  A 77 year old Caucasian female without prior history  of CAD who presents with chest pain.   PROBLEMS:  1. Chest pain.  2. Metastatic colon CA status post chemotherapy.  3. Status post subtotal colectomy in 2001.  4. Ovarian CA status post TAHBSO in February 2003.  5. Polymyalgia rheumatica.  6. Rheumatoid arthritis previously on gold shot and 12 years ago on      intermittent prednisone.  7. Asthma, which she had in young adulthood.  8. History of pneumothorax in 1960.   HISTORY OF PRESENT ILLNESS:  A 77 year old Caucasian female without  prior cardiac history.  She was in her usual state of health until two  nights ago when while lying down for bed, she developed left upper  chest, shoulder, and scapular dull and nagging pain without associated  symptoms that was somewhat worse with palpation and somewhat relieved  with ibuprofen.  Symptoms were intermittent throughout the night, and  she could not sleep well.  During the day yesterday, she went for a walk  without any problems and noted that the more up and about she was, the  less likely she was to have discomfort.  When she lay down for bed last  night, she noted recurrent discomfort again without associated symptoms  that remained intermittent throughout the night.  This morning, she had  some mild discomfort prior to going for her walk and then when she went  for a walk, it did not go away which she was expecting it too.  As a  result, she presented to [facility] ED.  There, cardiac markers were  negative x1 and an ECG was without acute ST-T changes.  A CT of the  chest was performed secondary to her history of metastatic CA, and this  showed no acute cardiopulmonary processes and no PE.  She is transferred  to [facility] for further evaluation.  Currently, she is pain free.   ALLERGIES:  No known drug allergies.   HOME MEDICATIONS:  None.   At [facility], she received aspirin 324 mg x1, morphine 2 mg IV x2,  nitroglycerin 0.5 mcg per minute.   FAMILY HISTORY:  Mother died at age 34 secondary to thoracic aneurysm.  She has a history of CABG, mitral valve repair.  Father died at 33 in a  plane crash.  He had an MI in his 30s.  She has a sister who is alive  and well at age 70.  She has a brother who died of end-stage renal  disease and diabetes at 12.   SOCIAL HISTORY:  She lives  in [place] by herself.  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  She has a 50-pack-year history of tobacco abuse quitting in 1992.  She seldom has an alcoholic beverage.  She denies any drug use.  She is  walking daily.   REVIEW OF SYSTEMS:  Positive for chest pain as outlined in the HPI,  otherwise, all systems reviewed was negative.   PHYSICAL EXAM:  VITAL SIGNS:  She is afebrile, heart rate of 72,  respirations 16, and blood pressure 130/72.  GENERAL:  Pleasant white female in no acute distress.  Awake, alert, and  oriented x3.  HEENT:  Normal.  Nares grossly intact, nonfocal.  SKIN:  Warm, dry without lesions or masses.  NECK:  No bruits or JVD.  LUNGS:  Respirations regular, unlabored.  Clear to auscultation.  CARDIAC:  Regular S1, S2.  No S3, S4 murmurs.  ABDOMEN:  Round, soft, nontender, nondistended.  Bowel sounds present  x4.  EXTREMITIES: Warm, dry and pink.  No clubbing, cyanosis, or edema.  Dorsalis pedis and posterior tibial pulse 2+ and equal bilaterally.   Chest x-ray from today showed  no acute cardiopulmonary abnormality.  CTA  of the chest from today showed no PE and no acute cardiopulmonary  abnormality.  An EKG shows sinus rhythm with PVC.  No acute ST-T  changes.  Hemoglobin 14.1, hematocrit 41.0, WBC 5.7, platelets 217,  sodium 141, potassium 3.7, chloride 103, CO2 30, BUN 14, creatinine  0.52, glucose 93, CK-MB less than 1.0, troponin I less than 0.5, and  calcium 9.3.   ASSESSMENT:  Chest pain, predominantly atypical with few typical  features.  Appears to be worse with palpation and usually better with  exertion, but possibly worse today with walking.  Plan to admit and  cycle cardiac enzymes (negative by point of care x1 already).  She has a  strong family history.  Provided that enzymes are negative, I will plan  to discharge in the a.m. with outpatient Myoview.   PLAN:  Aspirin, low-dose beta blocker. Check lipids.      [name], ANP      Jesse Sans. Daleen Squibb, MD, [facility]  Electronically Signed    CB/MEDQ  D:  01/05/2008  T:  01/06/2008  Job:  [id number]

## 2011-01-22 NOTE — Op Note (Signed)
NAMECARLINE, [name]               ACCOUNT NO.:  [id number]   MEDICAL RECORD NO.:  [id number]          PATIENT TYPE:  AMB   LOCATION:  DAY                           FACILITY:  [facility]   PHYSICIAN:  Lionel December, M.D.    DATE OF BIRTH:  1933/04/20   DATE OF PROCEDURE:  04/22/2006  DATE OF DISCHARGE:                                 OPERATIVE REPORT   PROCEDURE:  Flexible sigmoidoscopy.   INDICATIONS:  [name] is a 77 year old Caucasian female with history of colon  carcinoma who had subtotal colectomy back in 2001.  She had recurrence in  her pelvis and had laparotomy in 2005 at [facility] followed by chemo and has  remained in remission.  She is undergoing surveillance exam.  Procedure and  risks were reviewed with the patient, and informed consent was obtained.   MEDICATIONS FOR CONSCIOUS SEDATION:  Demerol 25 mg IV, Versed 3 mg IV.   FINDINGS:  Procedure performed in endoscopy suite.  The patient's vital  signs and O2 saturation were monitored during the procedure and remained  stable.  The patient was placed in left lateral position and rectal  examination performed.  No abnormality noted on external or digital exam.  Olympus videoscope was placed in rectum and advanced under vision into  sigmoid colon which was tortuous.  This was advanced to anastomosis which  was about 30 cm.  There were 2 nodules in this area which were ablated via  cold biopsy.  Distal segment of small bowel was examined and was normal.  Pictures taken for the record.  Mucosa of sigmoid colon and rectum was  carefully examined on the way out and was normal.  Scope was retroflexed to  examine anorectal junction, and small hemorrhoids were noted below the  dentate line.  Endoscope was straightened and withdrawn.  The patient  tolerated the procedure well.   FINAL DIAGNOSIS:  1. Two tiny nodules in area of the ileocolonic anastomosis which were      ablated via cold biopsy.  Normal examination reached sigmoid colon  and      rectum.  2. External hemorrhoids.   RECOMMENDATIONS:  She will resume her usual medications.  I will be  contacting the patient with results of biopsy.   She will return for repeat exam in 3 years from now.  Since she does not  have much colon left, she will be given a modified prep rather than a full  prep.      Lionel December, M.D.  Electronically Signed     NR/MEDQ  D:  04/22/2006  T:  04/22/2006  Job:  [id number]   cc:   Ladona Horns. Mariel Sleet, MD  Fax: [phone]   Graylin Shiver, M.D.  [facility] Department of Surgery   Scott A. Gerda Diss, MD  Fax: [phone]

## 2011-01-26 ENCOUNTER — Encounter (HOSPITAL_COMMUNITY): Payer: Medicare Other

## 2011-01-27 ENCOUNTER — Encounter (HOSPITAL_COMMUNITY): Payer: Medicare Other

## 2011-01-27 ENCOUNTER — Other Ambulatory Visit (HOSPITAL_COMMUNITY): Payer: Self-pay | Admitting: Oncology

## 2011-01-27 DIAGNOSIS — C189 Malignant neoplasm of colon, unspecified: Secondary | ICD-10-CM

## 2011-01-27 LAB — DIFFERENTIAL
Basophils Absolute: 0 10*3/uL (ref 0.0–0.1)
Basophils Absolute: 0 10*3/uL (ref 0.0–0.1)
Basophils Relative: 1 % (ref 0–1)
Basophils Relative: 1 % (ref 0–1)
Eosinophils Relative: 2 % (ref 0–5)
Lymphocytes Relative: 22 % (ref 12–46)
Monocytes Absolute: 0.4 10*3/uL (ref 0.1–1.0)
Neutro Abs: 4.2 10*3/uL (ref 1.7–7.7)
Neutrophils Relative %: 69 % (ref 43–77)
Neutrophils Relative %: 69 % (ref 43–77)

## 2011-01-27 LAB — COMPREHENSIVE METABOLIC PANEL
ALT: 6 U/L (ref 0–35)
AST: 15 U/L (ref 0–37)
Calcium: 9.9 mg/dL (ref 8.4–10.5)
GFR calc Af Amer: >60 mL/min (ref >60)
Glucose, Bld: 92 mg/dL (ref 70–99)
Sodium: 143 mEq/L (ref 135–145)
Total Protein: 6.3 g/dL (ref 6.0–8.3)

## 2011-01-27 LAB — CBC
Hemoglobin: 13.6 g/dL (ref 12.0–15.0)
MCHC: 32.3 g/dL (ref 30.0–36.0)
RDW: 19 % — ABNORMAL HIGH (ref 11.5–15.5)
WBC: 6 10*3/uL (ref 4.0–10.5)

## 2011-02-16 ENCOUNTER — Encounter (HOSPITAL_COMMUNITY): Payer: Medicare Other

## 2011-02-22 ENCOUNTER — Other Ambulatory Visit (HOSPITAL_COMMUNITY): Payer: Self-pay | Admitting: Oncology

## 2011-02-22 ENCOUNTER — Encounter (HOSPITAL_COMMUNITY): Payer: Medicare Other | Attending: Oncology

## 2011-02-22 ENCOUNTER — Encounter (HOSPITAL_COMMUNITY): Payer: Medicare Other | Admitting: Oncology

## 2011-02-22 DIAGNOSIS — C189 Malignant neoplasm of colon, unspecified: Secondary | ICD-10-CM

## 2011-02-22 DIAGNOSIS — Z85038 Personal history of other malignant neoplasm of large intestine: Secondary | ICD-10-CM | POA: Insufficient documentation

## 2011-02-22 DIAGNOSIS — Z79899 Other long term (current) drug therapy: Secondary | ICD-10-CM | POA: Insufficient documentation

## 2011-02-22 DIAGNOSIS — C50919 Malignant neoplasm of unspecified site of unspecified female breast: Secondary | ICD-10-CM | POA: Insufficient documentation

## 2011-02-22 LAB — CBC
HCT: 42.7 % (ref 36.0–46.0)
Hemoglobin: 14.2 g/dL (ref 12.0–15.0)
RBC: 4.59 MIL/uL (ref 3.87–5.11)
WBC: 5.7 10*3/uL (ref 4.0–10.5)

## 2011-02-22 LAB — DIFFERENTIAL
Basophils Absolute: 0.1 10*3/uL (ref 0.0–0.1)
Lymphocytes Relative: 25 % (ref 12–46)
Lymphs Abs: 1.4 10*3/uL (ref 0.7–4.0)
Neutro Abs: 3.8 10*3/uL (ref 1.7–7.7)

## 2011-02-22 LAB — COMPREHENSIVE METABOLIC PANEL
Albumin: 4.1 g/dL (ref 3.5–5.2)
Alkaline Phosphatase: 64 U/L (ref 39–117)
BUN: 12 mg/dL (ref 6–23)
CO2: 29 mEq/L (ref 19–32)
Chloride: 105 mEq/L (ref 96–112)
Glucose, Bld: 81 mg/dL (ref 70–99)
Potassium: 4.1 mEq/L (ref 3.5–5.1)
Total Bilirubin: 1.5 mg/dL — ABNORMAL HIGH (ref 0.3–1.2)

## 2011-02-23 LAB — CEA: CEA: 2 ng/mL (ref 0.0–5.0)

## 2011-03-09 ENCOUNTER — Encounter (HOSPITAL_COMMUNITY): Payer: Medicare Other

## 2011-03-15 ENCOUNTER — Other Ambulatory Visit (HOSPITAL_COMMUNITY): Payer: Medicare Other

## 2011-03-15 ENCOUNTER — Ambulatory Visit (HOSPITAL_COMMUNITY): Payer: Medicare Other | Admitting: Oncology

## 2011-03-15 ENCOUNTER — Encounter (HOSPITAL_COMMUNITY): Payer: Self-pay | Admitting: Oncology

## 2011-03-15 DIAGNOSIS — C189 Malignant neoplasm of colon, unspecified: Secondary | ICD-10-CM

## 2011-03-15 HISTORY — DX: Malignant neoplasm of colon, unspecified: C18.9

## 2011-03-17 ENCOUNTER — Encounter (HOSPITAL_COMMUNITY): Payer: Medicare Other | Attending: Oncology

## 2011-03-17 ENCOUNTER — Ambulatory Visit (HOSPITAL_COMMUNITY): Payer: Medicare Other | Admitting: Oncology

## 2011-03-17 DIAGNOSIS — C189 Malignant neoplasm of colon, unspecified: Secondary | ICD-10-CM | POA: Insufficient documentation

## 2011-03-17 LAB — DIFFERENTIAL
Basophils Absolute: 0 10*3/uL (ref 0.0–0.1)
Basophils Relative: 1 % (ref 0–1)
Eosinophils Relative: 2 % (ref 0–5)
Monocytes Absolute: 0.5 10*3/uL (ref 0.1–1.0)
Neutro Abs: 4 10*3/uL (ref 1.7–7.7)

## 2011-03-17 LAB — CBC
HCT: 44.1 % (ref 36.0–46.0)
MCHC: 33.6 g/dL (ref 30.0–36.0)
RDW: 16.3 % — ABNORMAL HIGH (ref 11.5–15.5)

## 2011-03-17 LAB — COMPREHENSIVE METABOLIC PANEL
AST: 19 U/L (ref 0–37)
Albumin: 3.9 g/dL (ref 3.5–5.2)
Calcium: 9.4 mg/dL (ref 8.4–10.5)
Chloride: 102 mEq/L (ref 96–112)
Creatinine, Ser: <0.47 mg/dL — ABNORMAL LOW (ref 0.50–1.10)

## 2011-03-17 NOTE — Progress Notes (Signed)
[name] presented for labwork. Labs per MD order drawn via Peripheral Line 23 gauge needle inserted in rt arm.  Tolerated procedure without incident. Patient tolerated procedure well.

## 2011-03-24 ENCOUNTER — Ambulatory Visit (HOSPITAL_COMMUNITY): Payer: Medicare Other | Admitting: Oncology

## 2011-03-29 ENCOUNTER — Ambulatory Visit (HOSPITAL_COMMUNITY): Payer: Medicare Other | Admitting: Oncology

## 2011-03-30 ENCOUNTER — Encounter (HOSPITAL_COMMUNITY): Payer: Medicare Other

## 2011-04-05 ENCOUNTER — Encounter (HOSPITAL_BASED_OUTPATIENT_CLINIC_OR_DEPARTMENT_OTHER): Payer: Medicare Other

## 2011-04-05 DIAGNOSIS — C189 Malignant neoplasm of colon, unspecified: Secondary | ICD-10-CM

## 2011-04-05 LAB — COMPREHENSIVE METABOLIC PANEL
ALT: 10 U/L (ref 0–35)
AST: 19 U/L (ref 0–37)
Albumin: 3.7 g/dL (ref 3.5–5.2)
CO2: 27 mEq/L (ref 19–32)
Chloride: 105 mEq/L (ref 96–112)
GFR calc non Af Amer: >60 mL/min (ref >60)
Potassium: 3.5 mEq/L (ref 3.5–5.1)
Sodium: 141 mEq/L (ref 135–145)
Total Bilirubin: 2.3 mg/dL — ABNORMAL HIGH (ref 0.3–1.2)

## 2011-04-05 LAB — URINALYSIS, ROUTINE W REFLEX MICROSCOPIC
Glucose, UA: NEGATIVE mg/dL
Hgb urine dipstick: NEGATIVE
Ketones, ur: NEGATIVE mg/dL
Protein, ur: NEGATIVE mg/dL
Urobilinogen, UA: 0.2 mg/dL (ref 0.0–1.0)

## 2011-04-05 LAB — DIFFERENTIAL
Basophils Absolute: 0 10*3/uL (ref 0.0–0.1)
Basophils Relative: 1 % (ref 0–1)
Eosinophils Relative: 1 % (ref 0–5)
Monocytes Absolute: 0.4 10*3/uL (ref 0.1–1.0)
Monocytes Relative: 6 % (ref 3–12)

## 2011-04-05 LAB — CBC
HCT: 42.6 % (ref 36.0–46.0)
Hemoglobin: 13.9 g/dL (ref 12.0–15.0)
MCH: 31.4 pg (ref 26.0–34.0)
MCHC: 32.6 g/dL (ref 30.0–36.0)
RDW: 15.7 % — ABNORMAL HIGH (ref 11.5–15.5)

## 2011-04-05 MED ORDER — HEPARIN SOD (PORK) LOCK FLUSH 100 UNIT/ML IV SOLN
500.0000 [IU] | Freq: Once | INTRAVENOUS | Status: AC
  Administered 2011-04-05: 500 [IU] via INTRAVENOUS

## 2011-04-05 MED ORDER — SODIUM CHLORIDE 0.9 % IJ SOLN
10.0000 mL | Freq: Once | INTRAMUSCULAR | Status: AC
  Administered 2011-04-05: 10 mL via INTRAVENOUS

## 2011-04-05 NOTE — Progress Notes (Signed)
Orvan Seen presented for Portacath access and flush. Proper placement of portacath confirmed by CXR. Portacath located rt chest wall accessed with  H 20 needle. Good blood return present. Specimen drawn for cbc diff cmet and ferritin. Urine specimen obtained because of symptoms present Portacath flushed with 20ml NS and 500U/60ml Heparin and needle removed intact. Procedure without incident. Patient tolerated procedure well.

## 2011-04-05 NOTE — Progress Notes (Signed)
Addended by: Dennie Maizes on: 04/05/2011 04:46 PM   Modules accepted: Orders

## 2011-04-06 ENCOUNTER — Ambulatory Visit (HOSPITAL_COMMUNITY): Payer: Medicare Other | Admitting: Oncology

## 2011-04-06 LAB — CEA: CEA: 2.2 ng/mL (ref 0.0–5.0)

## 2011-04-14 ENCOUNTER — Telehealth (HOSPITAL_COMMUNITY): Payer: Self-pay | Admitting: *Deleted

## 2011-04-14 ENCOUNTER — Encounter (HOSPITAL_COMMUNITY)
Admission: RE | Admit: 2011-04-14 | Discharge: 2011-04-14 | Disposition: A | Discharge status: Home / Self Care | Payer: Medicare Other | Source: Ambulatory Visit | Attending: Oncology | Admitting: Oncology

## 2011-04-14 ENCOUNTER — Encounter (HOSPITAL_COMMUNITY): Payer: Self-pay

## 2011-04-14 DIAGNOSIS — C679 Malignant neoplasm of bladder, unspecified: Secondary | ICD-10-CM | POA: Insufficient documentation

## 2011-04-14 DIAGNOSIS — C569 Malignant neoplasm of unspecified ovary: Secondary | ICD-10-CM | POA: Insufficient documentation

## 2011-04-14 DIAGNOSIS — I517 Cardiomegaly: Secondary | ICD-10-CM | POA: Insufficient documentation

## 2011-04-14 DIAGNOSIS — Z9221 Personal history of antineoplastic chemotherapy: Secondary | ICD-10-CM | POA: Insufficient documentation

## 2011-04-14 DIAGNOSIS — K449 Diaphragmatic hernia without obstruction or gangrene: Secondary | ICD-10-CM | POA: Insufficient documentation

## 2011-04-14 DIAGNOSIS — M899 Disorder of bone, unspecified: Secondary | ICD-10-CM | POA: Insufficient documentation

## 2011-04-14 DIAGNOSIS — N2889 Other specified disorders of kidney and ureter: Secondary | ICD-10-CM | POA: Insufficient documentation

## 2011-04-14 DIAGNOSIS — K8020 Calculus of gallbladder without cholecystitis without obstruction: Secondary | ICD-10-CM | POA: Insufficient documentation

## 2011-04-14 DIAGNOSIS — C189 Malignant neoplasm of colon, unspecified: Secondary | ICD-10-CM | POA: Insufficient documentation

## 2011-04-14 DIAGNOSIS — J438 Other emphysema: Secondary | ICD-10-CM | POA: Insufficient documentation

## 2011-04-14 DIAGNOSIS — I2510 Atherosclerotic heart disease of native coronary artery without angina pectoris: Secondary | ICD-10-CM | POA: Insufficient documentation

## 2011-04-14 MED ORDER — FLUDEOXYGLUCOSE F - 18 (FDG) INJECTION
16.1000 | Freq: Once | INTRAVENOUS | Status: AC | PRN
  Administered 2011-04-14: 16.1 via INTRAVENOUS

## 2011-04-15 ENCOUNTER — Encounter (HOSPITAL_COMMUNITY): Payer: Self-pay | Admitting: Oncology

## 2011-04-15 ENCOUNTER — Encounter (HOSPITAL_COMMUNITY): Payer: Medicare Other | Attending: Oncology | Admitting: Oncology

## 2011-04-15 VITALS — BP 151/92 | HR 71 | Temp 97.3°F | Wt 126.6 lb

## 2011-04-15 DIAGNOSIS — C189 Malignant neoplasm of colon, unspecified: Secondary | ICD-10-CM | POA: Insufficient documentation

## 2011-04-15 NOTE — Progress Notes (Signed)
Morgan Punt, MD, MD [address] [place] [address]  1. Recurrent adenocarcinoma of colon  CBC, Differential, Comprehensive metabolic panel, CEA    CURRENT THERAPY:S/P 6 cycles of Xeloda 1500 mg BID x 14 days every 28 days  INTERVAL HISTORY: [name] 77 y.o. female returns for  regular  visit for followup of recurrent metastatic adenocarcinoma of the colon.  She is now S/P 6 cycles of Xeloda for a 1.3 cm PET-avid lesion per PET scan in the pelvis.  The patient recently had a follow-up PET scan for restaging purposes following 6 cycles of Xeloda.  The PET scan reveals no evidence of hypermetabolic recurrent or metastatic disease.  This is wonderful news and the patient is quite pleased with the PET scan results.  I gave the patient a copy of her PET scan report.  The patient admits to only one complaint today.  She reports that she occasionally experiences a headache when she bends over.  It resolves upon standing up.    She denies any other complaints today.  She feels well and looks absolutely wonderful.    Past Medical History  Diagnosis Date  . Cancer   . Recurrent adenocarcinoma of colon 03/15/2011  . History of polymyalgia rheumatica     has Recurrent adenocarcinoma of colon on her problem list.     is allergic to sulfa drugs cross reactors.  Ms. Casseus had no medications administered during this visit.  Past Surgical History  Procedure Date  . Abdominal hysterectomy   . Appendectomy   . Tonsillectomy   . Anterior fascia resection via laparotomy   . Bladder surgery Dec 2011    Denies any headaches, dizziness, double vision, fevers, chills, night sweats, nausea, vomiting, diarrhea, constipation, chest pain, heart palpitations, shortness of breath, blood in stool, black tarry stool, urinary pain, urinary burning, urinary frequency, hematuria.   PHYSICAL EXAMINATION  ECOG PERFORMANCE STATUS: 0 - Asymptomatic  Filed Vitals:   04/15/11 0933  BP: 151/92    Pulse: 71  Temp: 97.3 F (36.3 C)    GENERAL:healthy, no distress, well nourished, well developed, comfortable, cooperative and smiling SKIN: skin color, texture, turgor are normal, no rashes or significant lesions HEAD: Normocephalic, No masses, lesions, tenderness or abnormalities EYES: normal, PERRLA, EOMI EARS: External ears normal OROPHARYNX:mucous membranes are moist  NECK: supple, no adenopathy, no bruits, no JVD, thyroid normal size, non-tender, without nodularity, no stridor, non-tender, trachea midline LYMPH:  no palpable lymphadenopathy BREAST:not examined LUNGS: clear to auscultation and percussion HEART: regular rate & rhythm, no murmurs, no gallops, S1 normal and S2 normal ABDOMEN:abdomen soft, non-tender and normal bowel sounds BACK: Back symmetric, no curvature., No CVA tenderness EXTREMITIES:less then 2 second capillary refill, no joint deformities, effusion, or inflammation, no edema, no skin discoloration, no clubbing, no cyanosis  NEURO: alert & oriented x 3 with fluent speech, no focal motor/sensory deficits, gait normal   LABORATORY DATA: CBC    Component Value Date/Time   WBC 6.1 04/05/2011 1645   RBC 4.42 04/05/2011 1645   HGB 13.9 04/05/2011 1645   HCT 42.6 04/05/2011 1645   PLT 173 04/05/2011 1645   MCV 96.4 04/05/2011 1645   MCH 31.4 04/05/2011 1645   MCHC 32.6 04/05/2011 1645   RDW 15.7* 04/05/2011 1645   LYMPHSABS 1.6 04/05/2011 1645   MONOABS 0.4 04/05/2011 1645   EOSABS 0.1 04/05/2011 1645   BASOSABS 0.0 04/05/2011 1645      Chemistry      Component Value Date/Time  NA 141 04/05/2011 1645   K 3.5 04/05/2011 1645   CL 105 04/05/2011 1645   CO2 27 04/05/2011 1645   BUN 20 04/05/2011 1645   CREATININE 0.62 04/05/2011 1645      Component Value Date/Time   CALCIUM 9.0 04/05/2011 1645   ALKPHOS 61 04/05/2011 1645   AST 19 04/05/2011 1645   ALT 10 04/05/2011 1645   BILITOT 2.3* 04/05/2011 1645     Lab Results  Component Value Date   CEA 2.2 04/05/2011      RADIOGRAPHIC STUDIES:  04/14/11  *RADIOLOGY REPORT*  Clinical Data: Subsequent treatment strategy for colon cancer.  Bladder cancer. Ovarian cancer. Chemotherapy last September 2011.  NUCLEAR MEDICINE PET CT SKULL BASE TO THIGH  Technique: 16.1 mCi F-18 FDG was injected intravenously via the  right power port. Full-ring PET imaging was performed from the  skull base through the mid-thighs 67 minutes after injection. CT  data was obtained and used for attenuation correction and anatomic  localization only. (This was not acquired as a diagnostic CT  examination.)  Fasting Blood Glucose: 98  Patient Weight: 123 pounds.  Comparison: P E T of 11/12/2010.  Findings: PET images demonstrate no abnormal activity to suggest  recurrent or metastatic disease. The previously described fluid  collection within the anterior pelvic wall has resolved with only  postoperative fascial thickening remaining.  CT images performed for attenuation correction demonstrate no  significant findings within the neck. Dense coronary artery  atherosclerosis with mild cardiomegaly. A tiny hiatal hernia.  Moderate centrilobular emphysema. Vascular calcification about the  medial left kidney. Gallstones. Extensive surgical changes,  including within the sigmoid colon. Small pericolonic lymph nodes  which are unchanged. Pelvic floor laxity. Moderate osteopenia.  IMPRESSION:  No evidence of hypermetabolic recurrent or metastatic disease.  Original Report Authenticated By: Consuello Bossier, M.D.   PATHOLOGY: KRAS negative    ASSESSMENT:  1. Recurrent metastatic adenocarcinoma of the colon, S/P Xeloda x 6 cycles.  Recent PET scan is negative for any hypermetabolic disease   PLAN:  1. Lab work in 6 weeks: CBC diff, CMET, CEA 2. Return in 6 weeks for follow-up 3. Defer surveillance program to Dr. Mariel Sleet in 6 weeks.   All questions were answered. The patient knows to call the clinic with any problems,  questions or concerns. We can certainly see the patient much sooner if necessary.  I spent 15 minutes counseling the patient face to face. The total time spent in the appointment was 25 minutes.  Dorcus Riga

## 2011-04-15 NOTE — Patient Instructions (Signed)
[facility]  Discharge Instructions  RECOMMENDATIONS MADE BY THE CONSULTANT AND ANY TEST RESULTS WILL BE SENT TO YOUR REFERRING DOCTOR.   EXAM FINDINGS BY MD TODAY AND SIGNS AND SYMPTOMS TO REPORT TO CLINIC OR PRIMARY MD: Return for labs in 6 weeks for CBC diff, CMET, & CEA and then to see provider.    I acknowledge that I have been informed and understand all the instructions given to me and received a copy. I do not have any more questions at this time, but understand that I may call the Specialty Clinic at [facility] at [phone] during business hours should I have any further questions or need assistance in obtaining follow-up care.    __________________________________________  _____________  __________ Signature of Patient or Authorized Representative            Date                   Time    __________________________________________ Nurse's Signature

## 2011-05-11 ENCOUNTER — Encounter (INDEPENDENT_AMBULATORY_CARE_PROVIDER_SITE_OTHER): Payer: Self-pay | Admitting: *Deleted

## 2011-05-24 ENCOUNTER — Encounter (HOSPITAL_COMMUNITY): Payer: Medicare Other | Attending: Oncology

## 2011-05-24 DIAGNOSIS — C189 Malignant neoplasm of colon, unspecified: Secondary | ICD-10-CM | POA: Insufficient documentation

## 2011-05-24 LAB — COMPREHENSIVE METABOLIC PANEL
Albumin: 3.7 g/dL (ref 3.5–5.2)
BUN: 11 mg/dL (ref 6–23)
Calcium: 9.1 mg/dL (ref 8.4–10.5)
Chloride: 105 mEq/L (ref 96–112)
Creatinine, Ser: <0.47 mg/dL — ABNORMAL LOW (ref 0.50–1.10)
Total Bilirubin: 1.1 mg/dL (ref 0.3–1.2)
Total Protein: 6.7 g/dL (ref 6.0–8.3)

## 2011-05-24 LAB — DIFFERENTIAL
Basophils Relative: 1 % (ref 0–1)
Eosinophils Absolute: 0.1 10*3/uL (ref 0.0–0.7)
Eosinophils Relative: 2 % (ref 0–5)
Monocytes Absolute: 0.5 10*3/uL (ref 0.1–1.0)
Monocytes Relative: 10 % (ref 3–12)

## 2011-05-24 LAB — CBC
HCT: 44.4 % (ref 36.0–46.0)
Hemoglobin: 14.9 g/dL (ref 12.0–15.0)
MCH: 31.2 pg (ref 26.0–34.0)
MCHC: 33.6 g/dL (ref 30.0–36.0)
MCV: 92.9 fL (ref 78.0–100.0)

## 2011-05-24 LAB — CEA: CEA: 2 ng/mL (ref 0.0–5.0)

## 2011-05-24 NOTE — Progress Notes (Signed)
Labs drawn today for cbc/diff,cmp,cea 

## 2011-05-25 ENCOUNTER — Encounter (HOSPITAL_BASED_OUTPATIENT_CLINIC_OR_DEPARTMENT_OTHER): Payer: Medicare Other | Admitting: Oncology

## 2011-05-25 VITALS — BP 132/80 | HR 83 | Temp 98.0°F | Wt 128.8 lb

## 2011-05-25 DIAGNOSIS — C189 Malignant neoplasm of colon, unspecified: Secondary | ICD-10-CM

## 2011-05-25 NOTE — Patient Instructions (Signed)
[facility]  Discharge Instructions  RECOMMENDATIONS MADE BY THE CONSULTANT AND ANY TEST RESULTS WILL BE SENT TO YOUR REFERRING DOCTOR.   EXAM FINDINGS BY MD TODAY AND SIGNS AND SYMPTOMS TO REPORT TO CLINIC OR PRIMARY MD:  PET Scan in February 2013.  INSTRUCTIONS GIVEN AND DISCUSSED:  Lab work due every 12 weeks.  SPECIAL INSTRUCTIONS/FOLLOW-UP:  Return to see MD in 1 year.  Call Dr.Neijstrom in 2 weeks if your cough is no better.    I acknowledge that I have been informed and understand all the instructions given to me and received a copy. I do not have any more questions at this time, but understand that I may call the Specialty Clinic at [facility] at [phone] during business hours should I have any further questions or need assistance in obtaining follow-up care.    __________________________________________  _____________  __________ Signature of Patient or Authorized Representative            Date                   Time    __________________________________________ Nurse's Signature

## 2011-05-25 NOTE — Progress Notes (Signed)
This office note has been dictated.

## 2011-06-02 LAB — CBC
MCV: 81.7
RBC: 5.01
WBC: 5.7

## 2011-06-02 LAB — BASIC METABOLIC PANEL
Calcium: 9.3
Chloride: 103
Creatinine, Ser: 0.52
GFR calc Af Amer: >60
Sodium: 141

## 2011-06-02 LAB — POCT CARDIAC MARKERS
CKMB, poc: 1.1
Operator id: 286941
Troponin i, poc: <0.05

## 2011-06-02 LAB — DIFFERENTIAL
Lymphs Abs: 1
Monocytes Relative: 8
Neutro Abs: 4.2
Neutrophils Relative %: 74

## 2011-06-02 LAB — PROTIME-INR: INR: 1

## 2011-06-03 LAB — CBC
HCT: 42.2
Hemoglobin: 14.1
MCHC: 33.5
RDW: 15.2

## 2011-06-03 LAB — DIFFERENTIAL
Basophils Absolute: 0
Eosinophils Absolute: 0
Lymphocytes Relative: 20
Lymphs Abs: 1.4
Neutrophils Relative %: 73

## 2011-06-03 LAB — COMPREHENSIVE METABOLIC PANEL
ALT: 10
CO2: 33 — ABNORMAL HIGH
Calcium: 9.7
Creatinine, Ser: 0.54
GFR calc non Af Amer: >60
Glucose, Bld: 81

## 2011-06-10 LAB — COMPREHENSIVE METABOLIC PANEL
CO2: 28 mEq/L (ref 19–32)
Calcium: 9.6 mg/dL (ref 8.4–10.5)
Creatinine, Ser: 0.45 mg/dL (ref 0.4–1.2)
GFR calc non Af Amer: >60 mL/min (ref >60)
Glucose, Bld: 105 mg/dL — ABNORMAL HIGH (ref 70–99)
Potassium: 3.5 mEq/L (ref 3.5–5.1)
Sodium: 139 mEq/L (ref 135–145)
Total Protein: 6.8 g/dL (ref 6.0–8.3)

## 2011-06-10 LAB — CBC
MCV: 83.3 fL (ref 78.0–100.0)
RBC: 5.13 MIL/uL — ABNORMAL HIGH (ref 3.87–5.11)
WBC: 6.8 10*3/uL (ref 4.0–10.5)

## 2011-06-10 LAB — DIFFERENTIAL
Lymphocytes Relative: 23 % (ref 12–46)
Lymphs Abs: 1.5 10*3/uL (ref 0.7–4.0)
Neutro Abs: 4.6 10*3/uL (ref 1.7–7.7)
Neutrophils Relative %: 68 % (ref 43–77)

## 2011-06-10 LAB — CEA: CEA: 3.2 ng/mL (ref 0.0–5.0)

## 2011-06-16 LAB — CEA: CEA: 1.6

## 2011-06-18 NOTE — Progress Notes (Signed)
CC:   Morgan A. Gerda Diss, MD [name], M.D. Morgan Sanfilippo, MD  DIAGNOSIS:  Recurrent adenocarcinoma of the colon with recently found disease in the pelvis 1.3 cm in size, PET-avid metastasis, close to the pubic symphysis.  She had been enrolled on a clinical trial but had failed and was then placed on capecitabine, and has had now 6 cycles, finishing her last dose approximately 3 weeks ago.  [name] had a PET scan in August which showed no evidence of disease.  What is disturbing, however, is that she is having headaches when she bends over, sharp shooting pains when she bends over, and this has been going on for about a month to 6 weeks.  She was found to be dehydrated from a laboratory standpoint with a BUN/creatinine ratio of greater than 32 back in late July.  She still has a BUN/creatinine ratio now of 23. She drinks a tremendous amount of caffeinated coffee, some green tea and does not work outside, does not sweat a lot, but that is the only reason we could find.  She was not having significant diarrhea or loose stools from the capecitabine.  PHYSICAL EXAMINATION:  Neurologic Exam:  Today she is alert.  She is oriented.  Memory is fine.  Facial symmetry is intact.  EOMs are intact. She follows all commands readily.  Strength is symmetrical in upper extremities.  Strength is symmetrical in the lower extremities.  Her gait and station are normal.  So I have asked her to drink at least 64 ounces of fluids a day, to limit her coffee or caffeine to 1 cup of coffee a day, and if she does not have improvement in her headaches to let me know in 2 weeks.  It is rare that colon cancer will go to the brain, but I think we would need to check her to make sure of this because she has never had these types of headaches before.  They do disappear when she stands up from bending over, but these pains are unusual and do not make sense to me yet. Otherwise, if things go away, she will let  me know one way or the other in 2 weeks.  We will see her back in 3 months.  We will do another PET scan in February, which will be 6 months from the one in August.  That has been the best way to monitor her.  We will continue to check her labs every 3 months.    ______________________________ [name]. Morgan Sleet, MD ESN/MEDQ  D:  05/25/2011  T:  05/25/2011  Job:  [id number]

## 2011-06-22 NOTE — Telephone Encounter (Signed)
Just trying to close the encounter. 

## 2011-06-30 ENCOUNTER — Other Ambulatory Visit (HOSPITAL_COMMUNITY): Payer: Self-pay | Admitting: *Deleted

## 2011-06-30 ENCOUNTER — Encounter (HOSPITAL_COMMUNITY): Payer: Medicare Other | Attending: Oncology

## 2011-06-30 DIAGNOSIS — M6281 Muscle weakness (generalized): Secondary | ICD-10-CM | POA: Insufficient documentation

## 2011-06-30 DIAGNOSIS — C189 Malignant neoplasm of colon, unspecified: Secondary | ICD-10-CM

## 2011-06-30 LAB — BASIC METABOLIC PANEL
CO2: 30 mEq/L (ref 19–32)
Calcium: 9.7 mg/dL (ref 8.4–10.5)
Chloride: 101 mEq/L (ref 96–112)
Potassium: 4.4 mEq/L (ref 3.5–5.1)
Sodium: 138 mEq/L (ref 135–145)

## 2011-06-30 LAB — CBC
HCT: 45.3 % (ref 36.0–46.0)
Hemoglobin: 14.7 g/dL (ref 12.0–15.0)
MCV: 90.6 fL (ref 78.0–100.0)
Platelets: 183 10*3/uL (ref 150–400)
RBC: 5 MIL/uL (ref 3.87–5.11)
WBC: 6.6 10*3/uL (ref 4.0–10.5)

## 2011-06-30 LAB — DIFFERENTIAL
Eosinophils Relative: 1 % (ref 0–5)
Lymphocytes Relative: 24 % (ref 12–46)
Lymphs Abs: 1.6 10*3/uL (ref 0.7–4.0)

## 2011-06-30 NOTE — Progress Notes (Signed)
[name] Seen presented for labwork. Labs per MD order drawn via  Peripheral Line 24 gauge needle inserted in rt ac Good blood return present. Procedure without incident.  Needle removed intact. Patient tolerated procedure well.

## 2011-06-30 NOTE — Progress Notes (Signed)
Labs drawn today for cbc/diff,bmp 

## 2011-07-06 ENCOUNTER — Encounter (HOSPITAL_COMMUNITY): Payer: Self-pay | Admitting: Emergency Medicine

## 2011-07-06 ENCOUNTER — Emergency Department (HOSPITAL_COMMUNITY): Payer: Medicare Other

## 2011-07-06 ENCOUNTER — Observation Stay (HOSPITAL_COMMUNITY)
Admission: EM | Admit: 2011-07-06 | Discharge: 2011-07-07 | Disposition: A | Discharge status: Other Acute Inpatient Hospital | Payer: Medicare Other | Source: Home / Self Care | Attending: Emergency Medicine | Admitting: Emergency Medicine

## 2011-07-06 ENCOUNTER — Other Ambulatory Visit: Payer: Self-pay

## 2011-07-06 DIAGNOSIS — Z8249 Family history of ischemic heart disease and other diseases of the circulatory system: Secondary | ICD-10-CM | POA: Insufficient documentation

## 2011-07-06 DIAGNOSIS — M79609 Pain in unspecified limb: Secondary | ICD-10-CM | POA: Insufficient documentation

## 2011-07-06 DIAGNOSIS — M25519 Pain in unspecified shoulder: Secondary | ICD-10-CM | POA: Insufficient documentation

## 2011-07-06 DIAGNOSIS — M25512 Pain in left shoulder: Secondary | ICD-10-CM | POA: Diagnosis present

## 2011-07-06 DIAGNOSIS — R9431 Abnormal electrocardiogram [ECG] [EKG]: Secondary | ICD-10-CM | POA: Insufficient documentation

## 2011-07-06 DIAGNOSIS — R42 Dizziness and giddiness: Secondary | ICD-10-CM | POA: Insufficient documentation

## 2011-07-06 DIAGNOSIS — I10 Essential (primary) hypertension: Secondary | ICD-10-CM | POA: Diagnosis present

## 2011-07-06 DIAGNOSIS — M25511 Pain in right shoulder: Secondary | ICD-10-CM | POA: Diagnosis present

## 2011-07-06 DIAGNOSIS — M25529 Pain in unspecified elbow: Secondary | ICD-10-CM | POA: Diagnosis present

## 2011-07-06 HISTORY — DX: Fibromyalgia: M79.7

## 2011-07-06 LAB — CBC
HCT: 45.7 % (ref 36.0–46.0)
Hemoglobin: 15.2 g/dL — ABNORMAL HIGH (ref 12.0–15.0)
MCH: 30.1 pg (ref 26.0–34.0)
MCV: 90.5 fL (ref 78.0–100.0)
Platelets: 187 10*3/uL (ref 150–400)
RBC: 5.05 MIL/uL (ref 3.87–5.11)

## 2011-07-06 LAB — BASIC METABOLIC PANEL
BUN: 8 mg/dL (ref 6–23)
Calcium: 9.8 mg/dL (ref 8.4–10.5)
Creatinine, Ser: 0.56 mg/dL (ref 0.50–1.10)
GFR calc non Af Amer: 89 mL/min — ABNORMAL LOW (ref >90)
Glucose, Bld: 84 mg/dL (ref 70–99)

## 2011-07-06 LAB — DIFFERENTIAL
Eosinophils Absolute: 0.1 10*3/uL (ref 0.0–0.7)
Eosinophils Relative: 1 % (ref 0–5)
Lymphs Abs: 1.4 10*3/uL (ref 0.7–4.0)
Monocytes Absolute: 0.5 10*3/uL (ref 0.1–1.0)
Monocytes Relative: 7 % (ref 3–12)

## 2011-07-06 LAB — CARDIAC PANEL(CRET KIN+CKTOT+MB+TROPI): CK, MB: 2.6 ng/mL (ref 0.3–4.0)

## 2011-07-06 MED ORDER — ONDANSETRON HCL 4 MG/2ML IJ SOLN: 4.0000 mg | Freq: Four times a day (QID) | INTRAMUSCULAR | Status: DC | PRN

## 2011-07-06 MED ORDER — ASPIRIN 81 MG PO CHEW
324.0000 mg | CHEWABLE_TABLET | Freq: Once | ORAL | Status: AC
  Administered 2011-07-06: 324 mg via ORAL
  Filled 2011-07-06: qty 4

## 2011-07-06 MED ORDER — HEPARIN SODIUM (PORCINE) 5000 UNIT/ML IJ SOLN
5000.0000 [IU] | Freq: Three times a day (TID) | INTRAMUSCULAR | Status: DC
  Filled 2011-07-06: qty 1

## 2011-07-06 MED ORDER — ZOLPIDEM TARTRATE 5 MG PO TABS: 5.0000 mg | ORAL_TABLET | Freq: Every evening | ORAL | Status: DC | PRN

## 2011-07-06 MED ORDER — ACETAMINOPHEN 650 MG RE SUPP: 650.0000 mg | Freq: Four times a day (QID) | RECTAL | Status: DC | PRN

## 2011-07-06 MED ORDER — NITROGLYCERIN 0.4 MG SL SUBL: 0.4000 mg | SUBLINGUAL_TABLET | SUBLINGUAL | Status: DC | PRN

## 2011-07-06 MED ORDER — ONDANSETRON HCL 4 MG PO TABS: 4.0000 mg | ORAL_TABLET | Freq: Four times a day (QID) | ORAL | Status: DC | PRN

## 2011-07-06 MED ORDER — DOCUSATE SODIUM 100 MG PO CAPS: 100.0000 mg | ORAL_CAPSULE | Freq: Two times a day (BID) | ORAL | Status: DC | PRN

## 2011-07-06 MED ORDER — SENNA 8.6 MG PO TABS: 2.0000 | ORAL_TABLET | Freq: Every day | ORAL | Status: DC | PRN

## 2011-07-06 MED ORDER — ASPIRIN EC 325 MG PO TBEC
325.0000 mg | DELAYED_RELEASE_TABLET | Freq: Every day | ORAL | Status: DC
  Administered 2011-07-06: 325 mg via ORAL
  Filled 2011-07-06: qty 1

## 2011-07-06 MED ORDER — ACETAMINOPHEN 325 MG PO TABS: 650.0000 mg | ORAL_TABLET | Freq: Four times a day (QID) | ORAL | Status: DC | PRN

## 2011-07-06 NOTE — ED Provider Notes (Signed)
History     CSN: [id number] Arrival date & time: 07/06/2011  4:14 PM   First MD Initiated Contact with Patient 07/06/11 1617      Chief Complaint  Patient presents with  . Arm Pain  . Shoulder Pain    (Consider location/radiation/quality/duration/timing/severity/associated sxs/prior treatment) Patient is a 77 y.o. female presenting with arm pain and shoulder pain. The history is provided by the patient.  Arm Pain This is a recurrent problem. The current episode started today (She describes sudden onset of bilateral elbow pain that radiated into her shoulders, and started an hour prior to arrival and has since resolved.). The problem occurs constantly. The problem has been resolved. Associated symptoms include arthralgias. Pertinent negatives include no abdominal pain, chest pain, congestion, diaphoresis, fatigue, fever, headaches, joint swelling, nausea, neck pain, numbness, rash, sore throat, vomiting or weakness. Associated symptoms comments: She had a similar episode 2 weeks ago,  Also lasting an hour, which resolved spontaneously.  She did have nausea with the first episode.  An ambulance was called for this first event,  An ekg was reported normal and she did not go to the hospital for the first event.  She was seen by her pcp this afternoon while she was having pain,  And was transported here by Dr. Alroy Dust. The symptoms are aggravated by nothing (The episodes were not triggered by exertion,  and there are no recognized alleviators). She has tried nothing for the symptoms.  Shoulder Pain Associated symptoms include arthralgias. Pertinent negatives include no abdominal pain, chest pain, congestion, diaphoresis, fatigue, fever, headaches, joint swelling, nausea, neck pain, numbness, rash, sore throat, vomiting or weakness.    Past Medical History  Diagnosis Date  . Cancer   . Recurrent adenocarcinoma of colon 03/15/2011  . History of polymyalgia rheumatica     Past Surgical History    Procedure Date  . Abdominal hysterectomy   . Appendectomy   . Tonsillectomy   . Anterior fascia resection via laparotomy   . Bladder surgery Dec 2011    History reviewed. No pertinent family history.  History  Substance Use Topics  . Smoking status: Former Smoker -- 2.0 packs/day for 28 years    Types: Cigarettes  . Smokeless tobacco: Not on file  . Alcohol Use: No    OB History    Grav Para Term Preterm Abortions TAB SAB Ect Mult Living                  Review of Systems  Constitutional: Negative for fever, diaphoresis and fatigue.  HENT: Negative for congestion, sore throat and neck pain.   Eyes: Negative.   Respiratory: Negative for chest tightness and shortness of breath.   Cardiovascular: Negative for chest pain.  Gastrointestinal: Negative for nausea, vomiting and abdominal pain.  Genitourinary: Negative.   Musculoskeletal: Positive for arthralgias. Negative for joint swelling.  Skin: Negative.  Negative for rash and wound.  Neurological: Negative for dizziness, weakness, light-headedness, numbness and headaches.  Hematological: Negative.   Psychiatric/Behavioral: Negative.     Allergies  Sulfa drugs cross reactors  Home Medications  No current outpatient prescriptions on file.  BP 166/84  Pulse 71  Temp(Src) 98.3 F (36.8 C) (Oral)  Resp 22  Ht 5\' 5"  (1.651 m)  Wt 123 lb (55.792 kg)  BMI 20.47 kg/m2  SpO2 100%  Physical Exam  Nursing note and vitals reviewed. Constitutional: She is oriented to person, place, and time. She appears well-developed and well-nourished.  HENT:  Head: Normocephalic  and atraumatic.  Eyes: Conjunctivae are normal.  Neck: Normal range of motion.  Cardiovascular: Normal rate, regular rhythm, normal heart sounds and intact distal pulses.   Pulmonary/Chest: Effort normal and breath sounds normal. She has no wheezes.  Abdominal: Soft. Bowel sounds are normal. There is no tenderness.  Musculoskeletal: Normal range of motion.   Neurological: She is alert and oriented to person, place, and time.  Skin: Skin is warm and dry.  Psychiatric: She has a normal mood and affect.    ED Course  Procedures (including critical care time)   Labs Reviewed  CARDIAC PANEL(CRET KIN+CKTOT+MB+TROPI)  CBC  DIFFERENTIAL  BASIC METABOLIC PANEL   No results found.   No diagnosis found.    MDM  Cannot rule out atypical anginal equivalent ,  Although non exertional episodes,  Yet accompanied by nausea without chest pain or sob.  History and exam not consistent with musculoskeletal or orthopedic source, not reproducible with ROM.  Denies neck pain.  Discussed with hospitalist Dr Kaylyn Layer who will evaluate patient in ed.  Disposition pending his consult with patient.        Candis Musa, PA 07/06/11 1935

## 2011-07-06 NOTE — ED Provider Notes (Signed)
Medical screening examination/treatment/procedure(s) were conducted as a shared visit with non-physician practitioner(s) and myself.  I personally evaluated the patient during the encounter  See my note  Celene Kras, MD 07/06/11 [phone]

## 2011-07-06 NOTE — H&P (Addendum)
PCP:   Lilyan Punt, MD, MD  Dr. Laurie Panda with [facility], research Oncology at [facility] Dr. Joselyn Glassman, surgery at [facility]  Chief Complaint:  Bilateral shoulder and elbow pain  HPI: [name] is an 77 y.o. 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   Pt reports that 2 wks ago while visiting a friend, she had sudden onset of bilateral anterior elbow pain radiating to bilateral shoulders, while seated. It felt like a "load on her shoulders." Associated with nausea, possibly dizziness. Called EMS, EKG was done, but no changes, so no furhter evaluation. Pain resolved spontaneously by 1 hour. No read chest pain.   Today, while workign at the [employer], she had repeat same episode. Sitting at her desk, had elbow to shoulder pain, aching, x1 hr. It vanished on its own. Associated lightheadedness. Went to see PCP Luking, who got EKG, was concerned for changes and sent to ED. Here, CE's negative x1, EKG changes are basically resolved. CXR unremarkable, o/w labs unremarkable. Vitals with HTN, but pt states doesn't have h/o HTN. Has FHx, but no other real cardiac RF's.   Of note, admitted in 2009 for atypical L chest pain, and Cardiology felt this not to be cardiac related. Had reportedly negative stress test. Also, had echo 04/2010 that was essentially negative. Pt reports she can walk as long as she wants, can go up numerous flights of stairs without typical angina.   Review of Systems:  As above, o/w negative for f/c/ns, weight loss, SOB, GI issues, dysuria, rash. Otherwise negative. Has finished Xeloda chemoTx for colon ca in the past 6wks, states had PET scan earlier this year that is reportedly negative for current met disease.   Past Medical History  Diagnosis Date  . Cancer   . Recurrent adenocarcinoma of colon  03/15/2011  . History of polymyalgia rheumatica   . Arthritis   . Fibromyalgia     Past Surgical History  Procedure Date  . Abdominal hysterectomy   . Appendectomy   . Tonsillectomy   . Anterior fascia resection via laparotomy   . Bladder surgery Dec 2011    Medications:  HOME MEDS:  Doesn't take any home meds per pt.  Prior to Admission medications   Not on File    Allergies:  Allergies  Allergen Reactions  . Sulfa Drugs Cross Reactors     Social History:  Quit cigs 20 yrs ago, no Etoh, drugs. Has 2 sons, but 1 is decesased.    reports that she has quit smoking. Her smoking use included Cigarettes. She has a 56 pack-year smoking history. She does not have any smokeless tobacco history on file. She reports that she does not drink alcohol or use illicit drugs.  Family History: Mother has what sounds like thoracic aortic aneurysm, and ? Abdominal aortic aneurysm Father deceased at 46yo of plane crash possibly while having an AMI. Had early heart disease in his 70's.  Son had AMI at 39yo leading to heart transplant, is now deceased.   Physical Exam: Filed Vitals:   07/06/11 1618 07/06/11 1832 07/06/11 1934 07/06/11 2203  BP: 166/84 163/89 142/80 170/93  Pulse: 71 68 81 65  Temp: 98.3 F (36.8 C)   98 F (36.7 C)  TempSrc: Oral   Oral  Resp: 22 18  20  Height:    5\' 5"  (1.651 m)  Weight:    57 kg (125 lb 10.6 oz)  SpO2: 100% 98% 95% 90%   Blood pressure 170/93, pulse 65, temperature 98 F (36.7 C), temperature source Oral, resp. rate 20, height 5\' 5"  (1.651 m), weight 57 kg (125 lb 10.6 oz), SpO2 90.00%.  General: Thin, pleasant, non obese F in ED stretcher, able to relate reliable history. Doesn't appear uncomfortable or in distress in the least.  HEENT: PERRL, EOMI, clear sclera, mouth moist, normal appearing, no lesions Lungs: CTAB no w/c/r/r, good air movement Heart: RRR, no m/r/g, benign exam. Radial pulses palpable Abd: soft NT ND, benign Extremities: warm,  well perfused, no cyanosis, no BLE edema. Thin extremities Neuro: non-focal, alert, pleasant, moving extremities   Labs & Imaging Results for orders placed during the hospital encounter of 07/06/11 (from the past 48 hour(s))  CARDIAC PANEL(CRET KIN+CKTOT+MB+TROPI)     Status: Normal   Collection Time   07/06/11  5:01 PM      Component Value Range Comment   Total CK 66  7 - 177 (U/L)    CK, MB 2.6  0.3 - 4.0 (ng/mL)    Troponin I <0.30  <0.30 (ng/mL)    Relative Index RELATIVE INDEX IS INVALID  0.0 - 2.5    CBC     Status: Abnormal   Collection Time   07/06/11  5:01 PM      Component Value Range Comment   WBC 7.7  4.0 - 10.5 (K/uL)    RBC 5.05  3.87 - 5.11 (MIL/uL)    Hemoglobin 15.2 (*) 12.0 - 15.0 (g/dL)    HCT 96.0  45.4 - 09.8 (%)    MCV 90.5  78.0 - 100.0 (fL)    MCH 30.1  26.0 - 34.0 (pg)    MCHC 33.3  30.0 - 36.0 (g/dL)    RDW 11.9  14.7 - 82.9 (%)    Platelets 187  150 - 400 (K/uL)   DIFFERENTIAL     Status: Normal   Collection Time   07/06/11  5:01 PM      Component Value Range Comment   Neutrophils Relative 73  43 - 77 (%)    Neutro Abs 5.6  1.7 - 7.7 (K/uL)    Lymphocytes Relative 19  12 - 46 (%)    Lymphs Abs 1.4  0.7 - 4.0 (K/uL)    Monocytes Relative 7  3 - 12 (%)    Monocytes Absolute 0.5  0.1 - 1.0 (K/uL)    Eosinophils Relative 1  0 - 5 (%)    Eosinophils Absolute 0.1  0.0 - 0.7 (K/uL)    Basophils Relative 1  0 - 1 (%)    Basophils Absolute 0.0  0.0 - 0.1 (K/uL)   BASIC METABOLIC PANEL     Status: Abnormal   Collection Time   07/06/11  5:01 PM      Component Value Range Comment   Sodium 141  135 - 145 (mEq/L)    Potassium 3.7  3.5 - 5.1 (mEq/L)    Chloride 102  96 - 112 (mEq/L)    CO2 31  19 - 32 (mEq/L)    Glucose, Bld 84  70 - 99 (mg/dL)    BUN 8  6 - 23 (mg/dL)    Creatinine, Ser 5.62  0.50 - 1.10 (mg/dL)    Calcium 9.8  8.4 - 10.5 (mg/dL)    GFR calc non Af Amer 89 (*) >  90 (mL/min)    GFR calc Af Amer >90  >90 (mL/min)    Dg Chest Portable 1  View  07/06/2011  *RADIOLOGY REPORT*  Clinical Data: Pain, history of ovarian cancer  PORTABLE CHEST - 1 VIEW  Comparison: 01/05/2008  Findings: Cardiomediastinal silhouette is stable.  Right central venous line is unchanged in position.  No acute infiltrate or pleural effusion.  No pulmonary edema.  IMPRESSION: No active disease.  No significant change.  Original Report Authenticated By: Natasha Mead, M.D.   Echo 04/2010   Study Conclusions    - Left ventricle: There was mild hypertrophy of the basilar septum.     Systolic function was normal. The estimated ejection fraction was     in the range of 55% to 60%.   - Aortic valve: Trivial regurgitation. Valve area: 2.66cm^2(VTI).     Valve area: 2.42cm^2 (Vmax).   - Mitral valve: Calcified annulus.   - Atrial septum: No defect or patent foramen ovale was identified.  EKG done at 821p (at PCP's office?) shows NSR 56 bpm, LAD with ? LAFB, normal P waves, QRS narrow at 100 ms, good RWP, very minimal STD in V5-6 but approaching 1mm in I, aVL with minimal STE isolated to V2. Isolated beat to beat STE in III but not in II, aVF.   EKG in ED appears back to normal.   Impression Present on Admission:  .Shoulder pain, bilateral .Abnormal EKG .Elbow pain .Hypertension  PLAN: 1. Abnormal EKG: She is not actually having chest pain, but bilateral shoulder pain. Her sxs are completely atypical and she has no RF's other than FHx. No prior cardiac history. However, EKG did appear somewhat concerning for high lateral LV strain and therefore admission for ROMI is not unreasonable. These EKG changes are now resolved, will trend EKG's. Would consider cardiology consultation to evaluate whether stress would be beneficial for further risk stratification. Will treat with ASA 325 until she rules out, but no further ACS meds as I am less suspicious for this.   2. Shoulder pain, elbow pain: Unclear this etiology. Considered aortic dissection, PE but this seems so  phenomenally low risk at this point that I think CTA would be unwarranted. She does carry a Dx of PMR and prior RA but this history is a bit unclear. Would rule out true cardiopulmonary pathology then consider plain films vs ESR/CRP.   3. HTN: Pt denies h/o HTN, but is running a bit HTN in the ED. Possibly situationally related, would just monitor for now.   Other plans as per orders.  Maysoon Lozada 07/06/2011, 10:35 PM     ~2a, second set of CE's comes back with positive Trop to 1.0 and minimally elevated CK and MB. Will now treat as NSTEMI -- starting Heparin with bolus, Plavix 300 load, Metoprolol PO. Continue ASA 325. Pt awoken, no complaints of pain or any complaints at all. EKG's with no ST deviations and have ordered stat cardiology consultation. Trending enzymes. Vitals stable, except O2 a bit low at 90%, put on supplemental O2. Will order echo for am.

## 2011-07-06 NOTE — ED Notes (Signed)
Pt c/o bilateral arm pain up from the elbow and bilateral shoulder pain. Pt was sent over by Dr. Gerda Diss. Pt states this episode happened about 2 weeks ago.

## 2011-07-06 NOTE — ED Provider Notes (Signed)
Date: 07/06/2011  Rate: 66  Rhythm: normal sinus rhythm  QRS Axis: left  Intervals: normal  ST/T Wave abnormalities: normal  Conduction Disutrbances:none  Narrative Interpretation: left ventricular hypertrophy  Old EKG Reviewed: no significant changes  Medical screening examination/treatment/procedure(s) were conducted as a shared visit with non-physician practitioner(s) and myself.  I personally evaluated the patient during the encounter  Patient presents with bilateral elbow and shoulder pain. Patient states the episode happened at rest. She did get somewhat nauseated with it. Patient has no known history of heart disease at this time she feels well. However this may have been an anginal equivalent. We'll plan on discussing the case with her primary  for possible admission for observation and possible stress testing.  Celene Kras, MD 07/06/11 [phone]

## 2011-07-07 ENCOUNTER — Inpatient Hospital Stay (HOSPITAL_COMMUNITY)
Admission: AD | Admit: 2011-07-07 | Discharge: 2011-07-23 | DRG: 233 | Disposition: A | Discharge status: Skilled Nursing Facility | Payer: Medicare Other | Source: Other Acute Inpatient Hospital | Attending: Thoracic Surgery (Cardiothoracic Vascular Surgery) | Admitting: Thoracic Surgery (Cardiothoracic Vascular Surgery)

## 2011-07-07 ENCOUNTER — Other Ambulatory Visit: Payer: Self-pay

## 2011-07-07 ENCOUNTER — Inpatient Hospital Stay (HOSPITAL_COMMUNITY): Payer: Medicare Other

## 2011-07-07 DIAGNOSIS — I2510 Atherosclerotic heart disease of native coronary artery without angina pectoris: Principal | ICD-10-CM | POA: Diagnosis present

## 2011-07-07 DIAGNOSIS — C189 Malignant neoplasm of colon, unspecified: Secondary | ICD-10-CM | POA: Insufficient documentation

## 2011-07-07 DIAGNOSIS — Z882 Allergy status to sulfonamides status: Secondary | ICD-10-CM

## 2011-07-07 DIAGNOSIS — E8779 Other fluid overload: Secondary | ICD-10-CM | POA: Diagnosis not present

## 2011-07-07 DIAGNOSIS — D649 Anemia, unspecified: Secondary | ICD-10-CM

## 2011-07-07 DIAGNOSIS — Z8543 Personal history of malignant neoplasm of ovary: Secondary | ICD-10-CM

## 2011-07-07 DIAGNOSIS — I959 Hypotension, unspecified: Secondary | ICD-10-CM | POA: Diagnosis not present

## 2011-07-07 DIAGNOSIS — I214 Non-ST elevation (NSTEMI) myocardial infarction: Secondary | ICD-10-CM | POA: Diagnosis present

## 2011-07-07 DIAGNOSIS — M069 Rheumatoid arthritis, unspecified: Secondary | ICD-10-CM | POA: Diagnosis present

## 2011-07-07 DIAGNOSIS — Z85038 Personal history of other malignant neoplasm of large intestine: Secondary | ICD-10-CM

## 2011-07-07 DIAGNOSIS — J90 Pleural effusion, not elsewhere classified: Secondary | ICD-10-CM | POA: Diagnosis not present

## 2011-07-07 DIAGNOSIS — Z87891 Personal history of nicotine dependence: Secondary | ICD-10-CM

## 2011-07-07 DIAGNOSIS — I10 Essential (primary) hypertension: Secondary | ICD-10-CM | POA: Diagnosis present

## 2011-07-07 DIAGNOSIS — IMO0001 Reserved for inherently not codable concepts without codable children: Secondary | ICD-10-CM | POA: Diagnosis present

## 2011-07-07 DIAGNOSIS — E785 Hyperlipidemia, unspecified: Secondary | ICD-10-CM | POA: Diagnosis present

## 2011-07-07 DIAGNOSIS — D62 Acute posthemorrhagic anemia: Secondary | ICD-10-CM | POA: Diagnosis not present

## 2011-07-07 DIAGNOSIS — I4891 Unspecified atrial fibrillation: Secondary | ICD-10-CM | POA: Diagnosis not present

## 2011-07-07 HISTORY — DX: Anemia, unspecified: D64.9

## 2011-07-07 LAB — CARDIAC PANEL(CRET KIN+CKTOT+MB+TROPI)
CK, MB: 5.2 ng/mL — ABNORMAL HIGH (ref 0.3–4.0)
Relative Index: INVALID (ref 0.0–2.5)
Total CK: 76 U/L (ref 7–177)
Total CK: 67 U/L (ref 7–177)
Troponin I: 0.76 ng/mL

## 2011-07-07 LAB — APTT: aPTT: 90 seconds — ABNORMAL HIGH (ref 24–37)

## 2011-07-07 LAB — HEPARIN LEVEL (UNFRACTIONATED): Heparin Unfractionated: 0.39 [IU]/mL (ref 0.30–0.70)

## 2011-07-07 LAB — LIPID PANEL
Cholesterol: 176 mg/dL (ref 0–200)
HDL: 51 mg/dL
LDL Cholesterol: 86 mg/dL (ref 0–99)
Total CHOL/HDL Ratio: 3.5 ratio
Triglycerides: 194 mg/dL — ABNORMAL HIGH
VLDL: 39 mg/dL (ref 0–40)

## 2011-07-07 LAB — PROTIME-INR
INR: 1.1 (ref 0.00–1.49)
Prothrombin Time: 14.4 s (ref 11.6–15.2)

## 2011-07-07 LAB — TSH: TSH: 1.126 u[IU]/mL (ref 0.350–4.500)

## 2011-07-07 LAB — MRSA PCR SCREENING: MRSA by PCR: NEGATIVE

## 2011-07-07 MED ORDER — CLOPIDOGREL BISULFATE 75 MG PO TABS
300.0000 mg | ORAL_TABLET | Freq: Once | ORAL | Status: AC
  Administered 2011-07-07: 300 mg via ORAL
  Filled 2011-07-07: qty 4

## 2011-07-07 MED ORDER — HEPARIN BOLUS VIA INFUSION
3000.0000 [IU] | Freq: Once | INTRAVENOUS | Status: AC
  Administered 2011-07-07: 3000 [IU] via INTRAVENOUS
  Filled 2011-07-07: qty 3000

## 2011-07-07 MED ORDER — CLOPIDOGREL BISULFATE 75 MG PO TABS: 75.0000 mg | ORAL_TABLET | Freq: Every day | ORAL | Status: DC

## 2011-07-07 MED ORDER — ROSUVASTATIN CALCIUM 20 MG PO TABS: 40.0000 mg | ORAL_TABLET | Freq: Every day | ORAL | Status: DC

## 2011-07-07 MED ORDER — SODIUM CHLORIDE 0.9 % IJ SOLN
INTRAMUSCULAR | Status: AC
  Filled 2011-07-07: qty 3

## 2011-07-07 MED ORDER — HEPARIN (PORCINE) IN NACL 100-0.45 UNIT/ML-% IJ SOLN
12.0000 [IU]/kg/h | INTRAMUSCULAR | Status: DC
  Administered 2011-07-07: 12 [IU]/kg/h via INTRAVENOUS
  Filled 2011-07-07: qty 250

## 2011-07-07 MED ORDER — CLOPIDOGREL BISULFATE 75 MG PO TABS: 300.0000 mg | ORAL_TABLET | Freq: Every day | ORAL | Status: DC

## 2011-07-07 MED ORDER — METOPROLOL TARTRATE 25 MG PO TABS
12.5000 mg | ORAL_TABLET | Freq: Two times a day (BID) | ORAL | Status: DC
  Administered 2011-07-07: 12.5 mg via ORAL
  Filled 2011-07-07: qty 1

## 2011-07-07 MED ORDER — METOPROLOL TARTRATE 25 MG/10 ML ORAL SUSPENSION
12.5000 mg | Freq: Two times a day (BID) | ORAL | Status: DC
  Filled 2011-07-07: qty 5

## 2011-07-07 NOTE — Progress Notes (Signed)
CRITICAL VALUE ALERT  Critical value received:  Ck and troponin   Date of notification:  07-07-11   Time of notification:  0200  Critical value read back: yes   Nurse who received alert:  b Milana Obey  MD notified (1st page):  Dr. Angus Palms  Time of first page: 0205  MD notified (2nd page):  Time of second page:  Responding MD:  Dr. Angus Palms  Time MD responded:  [phone]

## 2011-07-07 NOTE — Progress Notes (Signed)
07-07-11  0458 notified of increased troponin at approximately 0200 from lab draw.  EKG done per MD order and Dr. Kaylyn Layer arrived to floor and reviewed results with patient and initiated new orders.  During this time vital signs stable and patient has no c/o of any pain.  At approximately 0330, Dr. Kaylyn Layer decides to transfers patient and discusses decision with patient and patient's son over telephone.  Both verbalize understanding or treatment and transfer.  Vital signs stable and arrangements made for patient to be tranferred.

## 2011-07-08 ENCOUNTER — Inpatient Hospital Stay (HOSPITAL_COMMUNITY): Payer: Medicare Other

## 2011-07-08 DIAGNOSIS — R079 Chest pain, unspecified: Secondary | ICD-10-CM

## 2011-07-08 DIAGNOSIS — I2510 Atherosclerotic heart disease of native coronary artery without angina pectoris: Secondary | ICD-10-CM

## 2011-07-08 DIAGNOSIS — C189 Malignant neoplasm of colon, unspecified: Secondary | ICD-10-CM

## 2011-07-08 DIAGNOSIS — Z01810 Encounter for preprocedural cardiovascular examination: Secondary | ICD-10-CM

## 2011-07-08 LAB — BASIC METABOLIC PANEL
CO2: 28 mEq/L (ref 19–32)
Calcium: 9.1 mg/dL (ref 8.4–10.5)
Chloride: 105 mEq/L (ref 96–112)
Creatinine, Ser: 0.53 mg/dL (ref 0.50–1.10)
Glucose, Bld: 85 mg/dL (ref 70–99)
Sodium: 141 mEq/L (ref 135–145)

## 2011-07-08 LAB — CBC
Hemoglobin: 13.5 g/dL (ref 12.0–15.0)
MCH: 29.4 pg (ref 26.0–34.0)
MCV: 90 fL (ref 78.0–100.0)
RBC: 4.59 MIL/uL (ref 3.87–5.11)
WBC: 6.7 10*3/uL (ref 4.0–10.5)

## 2011-07-08 LAB — HEPARIN LEVEL (UNFRACTIONATED)
Heparin Unfractionated: <0.1 IU/mL — ABNORMAL LOW (ref 0.30–0.70)
Heparin Unfractionated: 0.13 IU/mL — ABNORMAL LOW (ref 0.30–0.70)

## 2011-07-08 NOTE — Cardiovascular Report (Signed)
[name], TEST               ACCOUNT NO.:  [id number]  MEDICAL RECORD NO.:  [id number]  LOCATION:  2913                         FACILITY:  [facility]  PHYSICIAN:  Lorine Bears, MD     DATE OF BIRTH:  1933/01/11  DATE OF PROCEDURE:  07/07/2011 DATE OF DISCHARGE:                           CARDIAC CATHETERIZATION   PROCEDURES PERFORMED: 1. Left heart catheterization. 2. Coronary angiography. 3. Left ventricular angiography.  INDICATION AND CLINICAL HISTORY:  This is a 77 year old female with no previous cardiac history.  She presented with arm and chest discomfort and was found to have small non-ST elevation myocardial infarction.  She was given aspirin, heparin, as well as Plavix 600 mg loading dose.  She was referred for cardiac catheterization and possible coronary intervention.  Risks, benefits, and alternatives were discussed with the patient.  STUDY DETAILS:  A standard informed consent was obtained.  The right radial area was prepped in a sterile fashion.  It was anesthetized with 1% lidocaine.  The artery was accessed by an anterior puncture.  I advanced the wire which went smoothly, but it did not advance all the way.  I was able to place the sheath and do angiography which showed tortuous and severely stenotic radial artery.  This was treated with 2 rounds of verapamil with no improvement.  I tried to advance a Research scientist (physical sciences), but could not do that.  Thus, the radial route was aborted.  The sheath was removed and a TR band was applied.  The right groin area was already prepped in sterile.  I placed a 5-French sheath in the right femoral artery after an anterior puncture.  Coronary angiography was performed with a JL-4, JR-4, and a pigtail catheter.  All catheter exchanges were done over the wire.  Before starting the procedure, the patient had an episode of vomiting and was feeling sick in her stomach. That was treated with Zofran.  She again became sick during  angiography and was given another dose of Zofran.  The vomiting and nausea caused her to have small hematoma in the groin which was held manually during the case.  STUDY FINDINGS:  Hemodynamic findings:  Left ventricular pressure is 191/14 with a left ventricular end-diastolic pressure of 23 mmHg. Aortic pressure is 192/97 with a mean pressure of 136 mmHg.  Left ventricular angiography:  This showed mildly reduced LV systolic function with an estimated ejection fraction of 45%.  Coronary angiography: 1. Left main coronary artery:  The vessel is normal in size and mildly     calcified.  It is short overall and has mild distal left main     disease.  There is no obstructive disease though. 2. Left anterior descending artery:  The vessel is normal in size and     moderately to heavily calcified in the proximal segment.  In the     proximal and ostial segment, there is a tubular 70% stenosis.  In     the distal LAD, there is a 60% stenosis close to the apex.  The     first diagonal branch is large in size with 80% ostial stenosis and     another tandem 80% lesion in  the proximal segment.  The second and     third diagonals are overall small in size. 3. Left circumflex artery:  The vessel is normal in size, and seems to     be moderately to severely calcified in the proximal segment.  It     gives a small OM-1.  Followed after that in the mid segment by 40%     calcified hazy stenosis.  In the mid segment at OM-2, there is a     95% stenosis which appears to be the culprit for non-ST elevation     myocardial infarction.  The OM-2 itself has a 95% ostial and     proximal disease.  This is a true bifurcation lesion.  There is     another 40% lesion in the mid segment at the origin of the     posterior AV groove artery. 4. Right coronary artery:  The vessel is overall small in diameter and     seems to be codominant.  There is a 20% ostial lesion followed by     an 80% lesion in the  proximal segment.  STUDY CONCLUSIONS: 1. Significant 3-vessel coronary artery disease. 2. Mildly reduced LV systolic function with mildly elevated left     ventricular end-diastolic pressure.  RECOMMENDATIONS:  Due to 3-vessel coronary artery disease and mildly reduced LV systolic function, I recommend coronary artery bypass graft surgery.  The culprit for her myocardial infarction is likely the left circumflex.  Treating this percutaneously is challenging given that it is a true bifurcation lesion.  It will likely also not yield a complete revascularization.  The patient already received a loading dose of Plavix and we will have to wait for at least 5-7 days before the surgery.  She needs aggressive blood pressure control.  I am also not certain why she was having vomiting before and during the procedure.  We will have to investigate that if she continues to be symptomatic.     Lorine Bears, MD     MA/MEDQ  D:  07/07/2011  T:  07/08/2011  Job:  [id number]  Electronically Signed by Lorine Bears MD on 07/08/2011 09:43:38 PM

## 2011-07-08 NOTE — H&P (Addendum)
[name], [name] NO.:  [id number]  MEDICAL RECORD NO.:  [id number]  LOCATION:  2913                         FACILITY:  [facility]  PHYSICIAN:  [name], M.D. DATE OF BIRTH:  04-Aug-1933  DATE OF ADMISSION:  07/06/2011 DATE OF DISCHARGE:                             HISTORY & PHYSICAL   PRIMARY CARDIOLOGIST:  Morgan Fus C. Daleen Squibb, MD, [facility]  PRIMARY ONCOLOGIST:  Morgan Horns. Mariel Sleet, MD  PRIMARY MEDICAL DOCTOR:  [name].  CHIEF COMPLAINT:  Arm pain.  REASON FOR ADMISSION:  NSTEMI.  HISTORY OF PRESENT ILLNESS:  [name] is a very pleasant 77 year old lady with a history of colon and ovarian cancer, hyperlipidemia, and probable hypertension with no significant cardiac history who presented to [facility] complaining of arm pain.  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, just didnot feel quite right.  She developed sudden onset of bilateral arm pain, accompanied by a frightened feeling.  It is hard to say if she became short of breath, but denies any nausea or diaphoresis.  She went to her primary care's office who sent her to the ER.  She had similar symptoms approximately 2 weeks ago and called the EMS, but apparently was told everything was okay.  She has had no exertional symptoms recently and denies any chest pain.  Her symptoms resolved spontaneously without intervention.  They did get transiently worse when she was in [name] office, but states by the time that she was seen in the ER her pain had resolved.  First set of cardiac enzymes were negative, but the second set showed a troponin of 1.0.  The patient also had dynamic EKG changes as evidenced by an initial EKG showing T-wave inversion and ST depression in aVL and I and subtle ST elevation in III and aVR. Followup EKG demonstrates normal sinus rhythm without acute  changes. She received aspirin, heparin bolus, and was loaded with 600 mg of Plavix after the hospital spoke with the fellow on call, who accepted the transfer.  The patient is currently resting comfortably in bed.  She is chest pain/arm pain free, just complains of feeling tired since she did not sleep overnight.  PAST MEDICAL HISTORY: 1. Metastatic colon cancer.     a.     Subtotal colectomy in 2001     b.     Received chemotherapy for a 1.3-cm PET avid lesion in the      pelvis with followup PET scan in August 2012 - for evidence of      hypermetabolic recurrence or metastatic disease 2. Ovarian cancer, status post TAH/BSO in February 2003. 3. Polymyalgia rheumatica. 4. Rheumatoid arthritis. 5. Asthma. 6. Pneumothorax in 1960. 7. Chest pain in 2009, felt probable musculoskeletal. 8. Possible hypertension with elevated pressures in the 130s-160s at     [facility]. 9. Normal LV function by echo with EF of 55-60% in August 2011 with     mild hypertrophy of the basilar septum, trivial AI, and calcified     mitral anulus.  OUTPATIENT MEDICATIONS:  None.  ALLERGIES:  SULFA.  SOCIAL HISTORY:  [name] lives at [place].  She has two sons, one is deceased.  She quit smoking approximately 20 years ago.  She drinks very seldom alcohol.  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  FAMILY HISTORY:  Mother had CABG, MVR, and what sounds like thoracic versus aortic aneurysm.  Father died in a plane crash, but reportedly had heart trouble in his 30s.  One of her sons had an MI leading to a heart transplant, but is now deceased.  REVIEW OF SYSTEMS:  No fevers, chills, sweats, weight changes, chest pain, dyspnea on exertion, orthopnea, edema, PND, palpitations, or syncope.  No nausea or vomiting.  All other systems reviewed and otherwise negative except for those noted in the HPI.  LABORATORY DATA:  WBC 7.7, hemoglobin  15.2, hematocrit 45.7, and platelet count 187.  Sodium 141, potassium 3.7, chloride 102, CO2 31, glucose 84, BUN 8, and creatinine 0.56.  First set of enzymes were negative and second set showed CK is 87, MB 6.3, and troponin 1.02.  RADIOLOGIC STUDIES:  Chest x-ray showed no acute disease.  No significant changes.  PHYSICAL EXAM:  VITAL SIGNS:  Temperature 98.6, pulse 61, respirations 16, blood pressure 159/79, and pulse ox 99% on 2 L. GENERAL:  This is a very pleasant, well-appearing white female, in no acute distress. HEENT:  Normocephalic and atraumatic with extraocular movements intact and clear sclerae.  Nares are without discharge. NECK:  Supple without carotid bruit or JVD. HEART:  Auscultation of the heart reveals regular rate and rhythm with normal S1 and S2 without murmurs, rubs, or gallops. LUNGS:  Lung sounds are clear to auscultation bilaterally without wheezes, rales, or rhonchi. ABDOMEN:  Soft, nontender, and nondistended.  Positive bowel sounds.  No rebound or guarding. EXTREMITIES:  Warm, dry, without edema.  She has 2+ pedal pulses bilaterally. NEUROLOGIC:  She is alert and oriented x3 and responds to questions appropriately with a normal affect.  ASSESSMENT/PLAN:  The patient was seen and examined by [name] and myself.  [name] is a very pleasant 77 year old lady with no prior cardiac history, but a history of hyperlipidemia, possible hypertension as evidenced by her blood pressure here in the hospital, and colon cancer/ovarian cancer as outlined above.  She presented initially at [facility] with complaints of bilateral arm pain with EKG showing transient ST depression and second set of cardiac enzymes revealing positive troponin of 1.02.  Her bilateral arm pain may be her anginal equivalent.  At this time, we will continue heparin and add a scheduled aspirin as well as beta-blockade and statin.  Fasting lipid panel will be checked.  We will  plan for catheterizations this afternoon.  Please note, she was already loaded with 600 mg of Plavix per discussion between Dr. Charm Barges and the hospitalist at [facility].  In regard to her hypertension, we will initiate her on beta- blocker and follow her blood pressure.  In the event she needs two agents, we would consider the addition of either a calcium channel blocker, ACE inhibitor, baseline catheterization/echocardiogram result.  Given her history of chemotherapy as recently as up to 2 months ago for cancer that appears stable at this time, we will also proceed with scheduling a 2-D echocardiogram.  Further recommendations will be based on catheterization findings.     Ronie Spies, P.A.C.   ______________________________ [name], M.D.  DD/MEDQ  D:  07/07/2011  T:  07/07/2011  Job:  [id number]  cc:   Thomas C. Daleen Squibb, MD, [facility] Morgan Horns. Mariel Sleet, MD [name]  Electronically Signed by [name] M.D. on 07/08/2011 32:44:01 PM Electronically Signed by Ronie Spies  on 07/10/2011 12:02:30 PM

## 2011-07-08 NOTE — Consult Note (Signed)
[name], [name]               ACCOUNT NO.:  [id number]  MEDICAL RECORD NO.:  [id number]  LOCATION:  2913                         FACILITY:  [facility]  PHYSICIAN:  Morgan Decent. Dorris [name], M.D.DATE OF BIRTH:  March 16, 1933  DATE OF CONSULTATION:  07/07/2011 DATE OF DISCHARGE:                                CONSULTATION   REASON FOR CONSULTATION:  Severe 3-vessel coronary disease status post non-Q-wave MI.  HPI:  [name] is a 77 year old woman with no prior cardiac history who presented to [facility] yesterday with a complaint of bilateral arm pain.  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  She said her arms felt very heavy and ached.  She did not have any nausea or diaphoresis.  She went to Dr. Fletcher Anon office and was sent to the emergency room.  There her first set of cardiac enzymes were negative, but her second set showed a positive troponin 1.02.  She was given aspirin, heparin, and 600 mg of Plavix and transferred to [facility]. Today, she underwent cardiac catheterization where she was found to have severe three-vessel coronary artery disease with ejection fraction of 40- 45%.  She currently denies any chest or arm pain.  PAST MEDICAL HISTORY:  Significant for metastatic colon cancer status post subtotal colectomy in 2001, recent round of chemotherapy or PET avid lesion in the pelvis, history of ovarian cancer, TAHBSO in February 2003, polymyalgia rheumatica, rheumatoid arthritis, asthma, previous pneumothorax, tattoo, and newly diagnosed dyslipidemia.  She was on no medications prior to admission.  She has an allergy to SULFA.  FAMILY HISTORY:  Strongly positive for coronary disease both mother and father, as well as one of her son who had MIs.  SOCIAL HISTORY:  She lives in [place].  She lives by herself.  She smoked up until about 20 years ago, very occasional alcohol.  She still works 2 jobs.  REVIEW OF  SYSTEMS:  Had felt well up until the acute event, although she did have an episode similar to this 2 weeks ago, but resolved spontaneously.  She was checked out by EMS at that time, and no abnormalities were found.  PHYSICAL EXAMINATION:  GENERAL:  [name] is a thin 77 year old, white female, in no acute distress.  She is well developed, well nourished, neurologically intact.  HEENT:  Unremarkable.  Neck is without thyromegaly, adenopathy, or bruits. CARDIAC:  Regular rate and rhythm.  Normal S1, S2.  No murmurs, rubs, or gallops. LUNGS:  Clear with equal breath sounds bilaterally. ABDOMEN:  Soft, nontender. EXTREMITIES:  Warm and dry.  She has 2+ pulses bilaterally.  LABORATORY DATA:  White count 7.7, hematocrit 46, platelets 187.  Sodium 141, potassium 3.7, BUN 8, creatinine 0.56, glucose 84.  Second set of cardiac enzymes had a CK of 87, MB of 6.3, and troponin of 1.02. Cardiac catheterization as previously noted.  She has severe three- vessel coronary disease.  IMPRESSION:  [name] is a 77 year old woman with multiple cardiac risk factors.  No prior history of coronary artery disease who presents with a non-Q-wave myocardial infarction and on catheterization has severe three-vessel coronary artery disease with impaired  left ventricular function.  Coronary artery bypass grafting is indicated for survival benefit as well as relief of symptoms.  I have discussed with her the indications, risks, benefits, and alternatives.  She understands there is a good chance of procedural success.  She does understand that the risks include but not limited to death, stroke, myocardial infarction, deep vein thrombosis, pulmonary embolism, bleeding, possible need for transfusions, infection, as well as other organ system dysfunction and respiratory, renal, hepatic, or GI complications.  She understands and accepts these risks and agrees to proceed.  She was loaded with Plavix so we need to  wait 5-7 days to allow the Plavix to wash out of her system prior to surgery. We will tentatively plan for surgery next Monday or Tuesday.  We do need to check with Dr. Glenford Peers on the status of her cancer and her prognosis from that standpoint prior to surgery.     [name], M.D.     SCH/MEDQ  D:  07/07/2011  T:  07/08/2011  Job:  [id number]  cc:   Jesse Sans. Wall, MD, [facility]. Gerda Diss, MD Ladona Horns. Mariel Sleet, MD  Electronically Signed by Charlett Lango M.D. on 07/08/2011 11:45:42 AM

## 2011-07-09 DIAGNOSIS — I2510 Atherosclerotic heart disease of native coronary artery without angina pectoris: Secondary | ICD-10-CM

## 2011-07-09 LAB — CBC
MCH: 28.9 pg (ref 26.0–34.0)
MCHC: 32.4 g/dL (ref 30.0–36.0)
Platelets: 156 10*3/uL (ref 150–400)
RDW: 13.2 % (ref 11.5–15.5)

## 2011-07-09 LAB — HEPARIN LEVEL (UNFRACTIONATED)
Heparin Unfractionated: 0.24 IU/mL — ABNORMAL LOW (ref 0.30–0.70)
Heparin Unfractionated: 0.24 IU/mL — ABNORMAL LOW (ref 0.30–0.70)

## 2011-07-10 ENCOUNTER — Inpatient Hospital Stay (HOSPITAL_COMMUNITY): Payer: Medicare Other

## 2011-07-10 LAB — CBC
HCT: 36.7 % (ref 36.0–46.0)
MCH: 29 pg (ref 26.0–34.0)
MCHC: 32.4 g/dL (ref 30.0–36.0)
MCV: 89.3 fL (ref 78.0–100.0)
RDW: 13.2 % (ref 11.5–15.5)
WBC: 5.5 10*3/uL (ref 4.0–10.5)

## 2011-07-10 LAB — BLOOD GAS, ARTERIAL
Acid-base deficit: 0.1 mmol/L (ref 0.0–2.0)
FIO2: 0.21 %
O2 Saturation: 90.9 %
Patient temperature: 98.6
TCO2: 25.3 mmol/L (ref 0–100)

## 2011-07-10 LAB — ABO/RH: ABO/RH(D): B POS

## 2011-07-10 LAB — COMPREHENSIVE METABOLIC PANEL
Albumin: 3.2 g/dL — ABNORMAL LOW (ref 3.5–5.2)
BUN: 13 mg/dL (ref 6–23)
Creatinine, Ser: 0.48 mg/dL — ABNORMAL LOW (ref 0.50–1.10)
Potassium: 3.6 mEq/L (ref 3.5–5.1)
Total Protein: 5.8 g/dL — ABNORMAL LOW (ref 6.0–8.3)

## 2011-07-10 LAB — PROTIME-INR: INR: 1.09 (ref 0.00–1.49)

## 2011-07-10 LAB — HEMOGLOBIN A1C: Mean Plasma Glucose: 114 mg/dL (ref <117)

## 2011-07-10 MED ORDER — DOPAMINE-DEXTROSE 3.2-5 MG/ML-% IV SOLN: 2.0000 ug/kg/min | INTRAVENOUS | Status: DC

## 2011-07-10 MED ORDER — ACETAMINOPHEN 325 MG PO TABS: 650.0000 mg | ORAL_TABLET | ORAL | Status: DC | PRN

## 2011-07-10 MED ORDER — DEXTROSE 5 % IV SOLN: 750.0000 mg | INTRAVENOUS | Status: DC

## 2011-07-10 MED ORDER — NITROGLYCERIN IN D5W 200-5 MCG/ML-% IV SOLN: 2.0000 ug/min | INTRAVENOUS | Status: DC

## 2011-07-10 MED ORDER — DEXTROSE 5 % IV SOLN: 0.5000 ug/min | INTRAVENOUS | Status: DC

## 2011-07-10 MED ORDER — ZOLPIDEM TARTRATE 5 MG PO TABS: 5.0000 mg | ORAL_TABLET | Freq: Every evening | ORAL | Status: DC | PRN

## 2011-07-10 MED ORDER — ROSUVASTATIN CALCIUM 20 MG PO TABS
20.0000 mg | ORAL_TABLET | Freq: Every day | ORAL | Status: DC
  Administered 2011-07-11 – 2011-07-13 (×2): 20 mg via ORAL
  Filled 2011-07-10 (×5): qty 1

## 2011-07-10 MED ORDER — DEXMEDETOMIDINE HCL 100 MCG/ML IV SOLN
0.1000 ug/kg/h | INTRAVENOUS | Status: DC
Start: 2011-07-12 — End: 2011-07-10

## 2011-07-10 MED ORDER — METOPROLOL TARTRATE 25 MG PO TABS
25.0000 mg | ORAL_TABLET | Freq: Two times a day (BID) | ORAL | Status: AC
  Administered 2011-07-11 (×2): 25 mg via ORAL
  Filled 2011-07-10 (×4): qty 1

## 2011-07-10 MED ORDER — SODIUM CHLORIDE 0.9 % IJ SOLN
3.0000 mL | Freq: Two times a day (BID) | INTRAMUSCULAR | Status: DC
  Administered 2011-07-11: 3 mL via INTRAVENOUS

## 2011-07-10 MED ORDER — ALPRAZOLAM 0.25 MG PO TABS: 0.2500 mg | ORAL_TABLET | Freq: Two times a day (BID) | ORAL | Status: DC | PRN

## 2011-07-10 MED ORDER — SODIUM CHLORIDE 0.9 % IV SOLN: INTRAVENOUS | Status: DC

## 2011-07-10 MED ORDER — PLASMA-LYTE 148 IV SOLN: INTRAVENOUS | Status: DC

## 2011-07-10 MED ORDER — DIAZEPAM 5 MG PO TABS
5.0000 mg | ORAL_TABLET | Freq: Once | ORAL | Status: AC
  Administered 2011-07-12: 5 mg via ORAL
  Filled 2011-07-10: qty 1

## 2011-07-10 MED ORDER — PHENYLEPHRINE HCL 10 MG/ML IJ SOLN: 30.0000 ug/min | INTRAMUSCULAR | Status: DC

## 2011-07-10 MED ORDER — FENTANYL CITRATE 0.05 MG/ML IJ SOLN: 50.0000 ug | INTRAMUSCULAR | Status: DC | PRN

## 2011-07-10 MED ORDER — CHLORHEXIDINE GLUCONATE 4 % EX LIQD
60.0000 mL | Freq: Once | CUTANEOUS | Status: DC
  Filled 2011-07-10: qty 118

## 2011-07-10 MED ORDER — METOPROLOL TARTRATE 25 MG PO TABS
12.5000 mg | ORAL_TABLET | Freq: Once | ORAL | Status: DC
  Filled 2011-07-10: qty 0.5

## 2011-07-10 MED ORDER — NITROGLYCERIN 0.4 MG SL SUBL: 0.4000 mg | SUBLINGUAL_TABLET | SUBLINGUAL | Status: DC | PRN

## 2011-07-10 MED ORDER — BISACODYL 5 MG PO TBEC: 5.0000 mg | DELAYED_RELEASE_TABLET | Freq: Once | ORAL | Status: DC

## 2011-07-10 MED ORDER — LORAZEPAM 0.5 MG PO TABS: 0.5000 mg | ORAL_TABLET | ORAL | Status: DC | PRN

## 2011-07-10 MED ORDER — TEMAZEPAM 15 MG PO CAPS: 15.0000 mg | ORAL_CAPSULE | Freq: Once | ORAL | Status: AC | PRN

## 2011-07-10 MED ORDER — ASPIRIN EC 325 MG PO TBEC
325.0000 mg | DELAYED_RELEASE_TABLET | Freq: Every day | ORAL | Status: DC
  Administered 2011-07-11 – 2011-07-14 (×3): 325 mg via ORAL
  Filled 2011-07-10 (×5): qty 1

## 2011-07-10 MED ORDER — ONDANSETRON HCL 4 MG/2ML IJ SOLN
4.0000 mg | Freq: Four times a day (QID) | INTRAMUSCULAR | Status: DC | PRN
  Administered 2011-07-14: 4 mg via INTRAVENOUS
  Filled 2011-07-10: qty 2

## 2011-07-11 LAB — URINALYSIS, ROUTINE W REFLEX MICROSCOPIC
Bilirubin Urine: NEGATIVE
Ketones, ur: NEGATIVE mg/dL
Leukocytes, UA: NEGATIVE
Nitrite: NEGATIVE
Protein, ur: NEGATIVE mg/dL
pH: 6 (ref 5.0–8.0)

## 2011-07-11 LAB — URINE MICROSCOPIC-ADD ON

## 2011-07-11 LAB — HEPARIN LEVEL (UNFRACTIONATED): Heparin Unfractionated: 0.27 IU/mL — ABNORMAL LOW (ref 0.30–0.70)

## 2011-07-11 MED ORDER — SODIUM CHLORIDE 0.9 % IV SOLN
INTRAVENOUS | Status: AC
  Administered 2011-07-12: 0.7 [IU]/h via INTRAVENOUS
  Filled 2011-07-11: qty 1

## 2011-07-11 MED ORDER — NITROGLYCERIN IN D5W 200-5 MCG/ML-% IV SOLN
2.0000 ug/min | INTRAVENOUS | Status: DC
  Filled 2011-07-11: qty 250

## 2011-07-11 MED ORDER — CEFUROXIME SODIUM 1.5 G IJ SOLR
1.5000 g | INTRAMUSCULAR | Status: DC
  Filled 2011-07-11: qty 1.5

## 2011-07-11 MED ORDER — HEPARIN (PORCINE) IN NACL 100-0.45 UNIT/ML-% IJ SOLN
1250.0000 [IU]/h | INTRAMUSCULAR | Status: DC
  Administered 2011-07-11: 1250 [IU]/h via INTRAVENOUS
  Filled 2011-07-11 (×2): qty 250

## 2011-07-11 MED ORDER — HEPARIN (PORCINE) IN NACL 100-0.45 UNIT/ML-% IJ SOLN
1350.0000 [IU]/h | INTRAMUSCULAR | Status: AC
  Filled 2011-07-11: qty 250

## 2011-07-11 MED ORDER — DEXTROSE 5 % IV SOLN
750.0000 mg | INTRAVENOUS | Status: DC
  Filled 2011-07-11: qty 750

## 2011-07-11 MED ORDER — VANCOMYCIN HCL 1000 MG IV SOLR
1250.0000 mg | INTRAVENOUS | Status: DC
  Filled 2011-07-11: qty 1250

## 2011-07-11 MED ORDER — SODIUM CHLORIDE 0.9 % IV SOLN
0.1000 ug/kg/h | INTRAVENOUS | Status: AC
  Administered 2011-07-12: 0.7 ug/kg/h via INTRAVENOUS
  Filled 2011-07-11 (×2): qty 4

## 2011-07-11 MED ORDER — SODIUM CHLORIDE 0.9 % IV SOLN
INTRAVENOUS | Status: AC
  Administered 2011-07-12: 15:00:00 via INTRAVENOUS
  Filled 2011-07-11: qty 40

## 2011-07-11 MED ORDER — PLASMA-LYTE 148 IV SOLN
INTRAVENOUS | Status: AC
  Administered 2011-07-12: 10:00:00
  Filled 2011-07-11: qty 0.5

## 2011-07-11 MED ORDER — METOPROLOL TARTRATE 12.5 MG HALF TABLET
12.5000 mg | ORAL_TABLET | Freq: Once | ORAL | Status: DC
  Filled 2011-07-11: qty 0.5

## 2011-07-11 MED ORDER — EPINEPHRINE HCL 1 MG/ML IJ SOLN
0.5000 ug/min | INTRAVENOUS | Status: DC
  Filled 2011-07-11: qty 4

## 2011-07-11 MED ORDER — DOPAMINE-DEXTROSE 3.2-5 MG/ML-% IV SOLN
2.0000 ug/kg/min | INTRAVENOUS | Status: DC
  Filled 2011-07-11: qty 250

## 2011-07-11 MED ORDER — PHENYLEPHRINE HCL 10 MG/ML IJ SOLN
30.0000 ug/min | INTRAVENOUS | Status: AC
  Administered 2011-07-12: 15 ug/min via INTRAVENOUS
  Filled 2011-07-11: qty 2

## 2011-07-11 NOTE — Progress Notes (Signed)
ANTICOAGULATION CONSULT NOTE - Follow Up Consult  Pharmacy Consult for heparin Indication: CAD, awaiting CABG 11/5  Allergies  Allergen Reactions  . Sulfa Drugs Cross Reactors     Patient Measurements: Height: 5\' 5"  (165.1 cm) (Entered during cutover) Weight: 124 lb 12.5 oz (56.6 kg) (Entered during cutover) IBW/kg (Calculated) : 57  Adjusted Body Weight:   Vital Signs: Temp: 97.4 F (36.3 C) (11/04 1426) Temp src: Oral (11/04 1426) BP: 104/68 mmHg (11/04 1426) Pulse Rate: 60  (11/04 1426)  Labs:  Basename 07/11/11 1853 07/10/11 0535 07/09/11 2130  HGB -- 11.9* 12.2  HCT -- 36.7 37.7  PLT -- 136* 156  APTT -- 75* --  LABPROT -- 14.3 --  INR -- 1.09 --  HEPARINUNFRC 0.27* 0.44 0.24*  CREATININE -- 0.48* --  CKTOTAL -- -- --  CKMB -- -- --  TROPONINI -- -- --   Estimated Creatinine Clearance: 54.3 ml/min (by C-G formula based on Cr of 0.48).     Assessment: 77 yo female on IV heparin for 3V CAD, awaiting CABG tomorrow.  Heparin level slightly below goal of 0.3-0.7 Goal of Therapy:  Heparin level 0.3-0.7 units/ml   Plan:  Increase IV heparin infusion to 1350 units/hr.  No follow-up level needed since heparin off at 4 AM.  Dominiqua Cooner, Kara Dies 07/11/2011,8:10 PM

## 2011-07-11 NOTE — Plan of Care (Signed)
Problem: Consults Goal: Cardiac Surgery Patient Education ( See Patient Education module for education specifics.) Outcome: Completed/Met Date Met:  07/11/11 Patient educated on upcoming cardiac surgery

## 2011-07-11 NOTE — Progress Notes (Signed)
Subjective:  She is stable and awaiting CABG tomorrow with Dr. [name].   Objective:  Vital Signs in the last 24 hours: Temp:  [97.8 F (36.6 C)] 97.8 F (36.6 C) (11/04 0411) Pulse Rate:  [58-65] 65  (11/04 1004) Resp:  [18] 18  (11/04 0411) BP: (118-136)/(74-75) 118/74 mmHg (11/04 1004) SpO2:  [96 %] 96 % (11/04 0411)  Intake/Output from previous day: 11/03 0701 - 11/04 0700 In: 192 [P.O.:120; I.V.:72] Out: 300 [Urine:300]   Physical Exam: General: Thin elderly woman in no distress Head:  Normocephalic and atraumatic. Lungs: Clear to auscultation and percussion. Heart: Normal S1 and S2.  No murmur, rubs or gallops.  Pulses: Pulses normal in all 4 extremities. Extremities: No clubbing or cyanosis. No edema. Neurologic: Alert and oriented x 3.    Lab Results:  Basename 07/10/11 0535 07/09/11 2130  WBC 5.5 6.2  HGB 11.9* 12.2  PLT 136* 156    Basename 07/10/11 0535  NA 142  K 3.6  CL 107  CO2 26  GLUCOSE 92  BUN 13  CREATININE 0.48*   No results found for this basename: TROPONINI:2,CK,MB:2 in the last 72 hours Hepatic Function Panel  Basename 07/10/11 0535  PROT 5.8*  ALBUMIN 3.2*  AST 13  ALT 8  ALKPHOS 52  BILITOT 1.0  BILIDIR --  IBILI --   No results found for this basename: CHOL in the last 72 hours No results found for this basename: PROTIME in the last 72 hours  Imaging: Dg Chest 2 View  07/10/2011  *RADIOLOGY REPORT*  Clinical Data: Scheduled for bypass surgery.  History of smoking and respiratory problems  CHEST - 2 VIEW  Comparison: 07/07/2011  Findings: The cardiac silhouette is normal in size and configuration.  The aorta is mildly uncoiled.  No mediastinal or hilar masses or adenopathy are evident.  A well-positioned right anterior chest wall Port-A-Cath is stable from the prior exam. Lungs are hyperexpanded.  Minimal scarring at the apices.  The lungs are otherwise clear.  Bony thorax is demineralized.  IMPRESSION: Findings suggest COPD.   No acute cardiopulmonary disease.  Original Report Authenticated By:         Assessment/Plan:   Patient Active Hospital Problem List:  Coronary artery disease (07/11/2011)   Assessment: Stable   Plan:   CABG tomorrow      Shawnie Pons MD, [facility] 07/11/2011, 10:30 AM

## 2011-07-11 NOTE — Consult Note (Signed)
NAMESHATORIA, STOOKSBURY NO.:  [id number]  MEDICAL RECORD NO.:  [id number]  LOCATION:  2037                         FACILITY:  [facility]  PHYSICIAN:  [name], M.D.     DATE OF BIRTH:  27-May-1933  DATE OF CONSULTATION:  07/08/2011 DATE OF DISCHARGE:                                CONSULTATION   REASON FOR CONSULT:  Metastatic adenocarcinoma of the colon.  PRIMARY CARE PHYSICIAN:  Dr. Gerda Diss.  HISTORY OF PRESENT ILLNESS:  [name] is a very pleasant 77 year old white female, patient of Dr. Mariel [name], with a history of stage B2 adenocarcinoma of the descending colon, status post resection on July 22, 2000, at which time, was T3 N0 adenocarcinoma, status post 6 months of adjuvant 5-FU with leucovorin with left ovarian recurrence in 2003, status post total abdominal hysterectomy with bilateral salpingo-oophorectomies and on October 16, 2001 with pathology positive for adenocarcinoma involving the left ovary, status post neoadjuvant Xeloda and oxaliplatin.  She underwent a surveillance.  PET CT on February 2005 showing elevated FDG on the left lower quadrant mass measuring 1.1 cm in the left lower quadrant, for which she had a CT- guided biopsy on February 25, 2004.  These showed adenocarcinoma of the colorectal primary.  She had resection of small bowel and the retroperitoneal mass on March 18, 2004, with a pathology positive for a 1.5 cm nodule consistent with adenocarcinoma.  She had Xeloda with oxaliplatin and Avastin for 6 months, followed with serial imaging with no evidence of disease until a PET scan on February 23, 2010, showed a new 1.3 cm avid FDG mass just anterior to the pubic symphysis, with biopsy on March 05, 2010 positive for adenocarcinoma from colorectal cancer. She was enrolled in a clinical trial receiving Xeloda, oxaliplatin, and Avastin as well as Everolimus beginning on April 28, 2010, with interval response, with mass measuring 1.4 x 0.9 cm,  compared to the prior 1.5 cm.  No evidence of distant metastatic disease.  She began Xeloda, total plan doses of the medicine for 6 cycles, starting on Feb 01, 2011, with a cycle 5 on February 22, 2011 until March 14, 2011.  The patient did not receive a cycle 6 of Xeloda.  However, she had a good response as to the PET scan on April 14, 2011, with no evidence of hypermetabolic recurrence or metastatic disease.  Her last CEA was 2.0 on February 24, 2011.  The patient was admitted to [facility] with NSTEMI, undergoing cardiac catheterization at [facility] after transfer. A 3-vessel disease was found, per cardiac catheterization, for which she needs CABG.  We were asked to see her while being hospitalized for clearance from the Oncology standpoint.  PAST MEDICAL HISTORY: 1. Metastatic colon cancer as above. 2. Hypertension. 3. Asthma. 4. Hyperlipidemia. 5. Polymyalgia rheumatica. 6. Rheumatoid arthritis with gold shots for 12 years. 7. Fibromyalgia. 8. Pneumothorax in 1960.  SURGERIES: 1. Status post colon resection on July 22, 2000 (right     hemicolectomy). 2. Status post tonsillectomy. 3. Status post appendectomy in November 2001 at the time of her colon     resection.  ALLERGIES:  SULFA.  MEDICATIONS: 1. Ecotrin. 2.  Heparin. 3. Lopressor. 4. Crestor. 5. Tylenol. 6. Xanax. 7. Fentanyl. 8. Nitroglycerin. 9. Zofran. 10.Ambien.  REVIEW OF SYSTEMS:  See HPI for significant positives.  Essentially, they are negative, with the exception of atypical left chest pain, for which she presented to the hospital and admitted on July 07, 2011. No respiratory complaints.  No fatigue.  Denies any acute bleeding. Rest of the review of systems is negative.  FAMILY HISTORY:  Mother died at 61 with aneurysm.  Father dying in a plane crash after having an MI in his 20s.  She had one sister who died with melanoma, one brother who has multiple medical issues, one brother died with  MI.  SOCIAL HISTORY:  The patient is divorced, 2 children, 1 of the them died at 3 of suicide, 1 son is alive, status post heart transplant at age 56.  The patient lives in [place].  Full code.  She worked at [employer] home, now works for [employer].  She is a retired Pension scheme manager as well.  She quit 20 years ago the use of 2 packs of cigarettes a day for at least 35 years.  No alcohol or recreational drugs.  PHYSICAL EXAMINATION:  GENERAL:  This is a thin 77 year old white female, in no acute distress, alert and oriented x3, who looks younger than her stated age. VITAL SIGNS:  Blood pressure 137/65, pulse 82, respirations 19, temperature 98.1, O2 sats 96% in 2 L, weight 57 kg, height 5 feet 5 inches. HEENT:  Normocephalic, atraumatic.  PERRLA.  Oral cavity without thrush or lesions. NECK:  Supple.  No cervical or supraclavicular masses. LUNGS:  Clear to auscultation bilaterally.  No axillary masses. CARDIOVASCULAR:  Regular rate and rhythm without murmurs, rubs, or gallops. ABDOMEN:  Soft, nontender.  Bowel sounds X4.  No hepatosplenomegaly. GU AND RECTAL:  Deferred. EXTREMITIES:  No clubbing or cyanosis.  No edema.  No inguinal masses. SKIN:  Without lesions, bruising, or petechial rash. NEUROLOGIC:  Nonfocal.  LABORATORY DATA:  Hemoglobin 13.5, hematocrit 41.3, white count 6.7, platelets 170, MCV 90, ANC 5.6, lymphocytes 1.4, monocytes 0.5.  Sodium 141, potassium 3.7, BUN 10, creatinine 0.53, glucose 85, TSH 1.126.  ASSESSMENT AND PLAN:  [name] is a patient of Dr. Mariel [name], who has a longstanding history of colon cancer, most recently receiving Xeloda as a single agent.  The recent PET scan on April 18, 2011 reveals no evidence of recurrent cancer.  The recommendations are to proceed with the planned surgical procedure.  Dr. Welton Flakes spoke with the patient and she would like everything done at this time. Her overall oncologic prognosis is good since the  scans are negative for recurrent cancer.  Thank you very much for the consultation.     [name], [name]   ______________________________ [name], M.D.    SW/MEDQ  D:  07/10/2011  T:  07/11/2011  Job:  [id number]  cc:   Dr. Bernadene Person. Morgan Sleet, MD Jesse Sans. Daleen Squibb, MD, [facility] Cassell Clement, M.D.

## 2011-07-11 NOTE — Progress Notes (Signed)
Procedure(s) (LRB): CORONARY ARTERY BYPASS GRAFTING (CABG)TIMES FOUR (N/A) Subjective: No CP or SOB  Objective: Vital signs in last 24 hours: Temp:  [97.8 F (36.6 [name])] 97.8 F (36.6 [name]) (11/04 0411) Pulse Rate:  [58-65] 65  (11/04 1004) Cardiac Rhythm:  [-] Normal sinus rhythm;Sinus bradycardia (11/04 0736) Resp:  [18] 18  (11/04 0411) BP: (118-136)/(74-75) 118/74 mmHg (11/04 1004) SpO2:  [96 %] 96 % (11/04 0411)  Hemodynamic parameters for last 24 hours:    Intake/Output from previous day: 11/03 0701 - 11/04 0700 In: 192 [P.O.:120; I.V.:72] Out: 300 [Urine:300] Intake/Output this shift:    exam unchanged  Lab Results:  Basename 07/10/11 0535 07/09/11 2130  WBC 5.5 6.2  HGB 11.9* 12.2  HCT 36.7 37.7  PLT 136* 156   BMET:  Basename 07/10/11 0535  NA 142  K 3.6  CL 107  CO2 26  GLUCOSE 92  BUN 13  CREATININE 0.48*  CALCIUM 8.9    PT/INR:  Basename 07/10/11 0535  LABPROT 14.3  INR 1.09   ABG    Component Value Date/Time   PHART 7.401* 07/10/2011 0455   HCO3 24.1* 07/10/2011 0455   TCO2 25.3 07/10/2011 0455   ACIDBASEDEF 0.1 07/10/2011 0455   O2SAT 90.9 07/10/2011 0455   CBG (last 3)  No results found for this basename: GLUCAP:3 in the last 72 hours  Assessment/Plan: S/P Procedure(s) (LRB): CORONARY ARTERY BYPASS GRAFTING (CABG)TIMES FOUR (N/A) For CABG in AM  All questions answered D/[name] heparin gtt at 0400  LOS: 4 days    [name] 07/11/2011

## 2011-07-11 NOTE — Progress Notes (Signed)
  I have discussed with the patient the general nature of coronary artery bypass grafting, need for general anesthesia,and incisions to be used. I have discussed the expected hospital stay, overall recovery and short and long term outcomes. They understand the risks include but are not limited to death, stroke, MI, DVT/PE, bleeding, possible need for transfusion, infections,other organ system dysfunction including respiratory, renal, or GI complications. She understands and accept these risks and agree to proceed.

## 2011-07-12 ENCOUNTER — Other Ambulatory Visit: Payer: Self-pay

## 2011-07-12 ENCOUNTER — Inpatient Hospital Stay (HOSPITAL_COMMUNITY): Payer: Medicare Other

## 2011-07-12 ENCOUNTER — Encounter (HOSPITAL_COMMUNITY)
Admission: AD | Disposition: A | Discharge status: Skilled Nursing Facility | Payer: Self-pay | Source: Other Acute Inpatient Hospital | Attending: Thoracic Surgery (Cardiothoracic Vascular Surgery)

## 2011-07-12 ENCOUNTER — Encounter (HOSPITAL_COMMUNITY): Payer: Self-pay | Admitting: Anesthesiology

## 2011-07-12 ENCOUNTER — Inpatient Hospital Stay (HOSPITAL_COMMUNITY): Payer: Medicare Other | Admitting: Anesthesiology

## 2011-07-12 DIAGNOSIS — I2510 Atherosclerotic heart disease of native coronary artery without angina pectoris: Secondary | ICD-10-CM

## 2011-07-12 HISTORY — PX: CORONARY ARTERY BYPASS GRAFT: SHX141

## 2011-07-12 LAB — CBC
Hemoglobin: 9.6 g/dL — ABNORMAL LOW (ref 12.0–15.0)
MCH: 28.9 pg (ref 26.0–34.0)
MCHC: 33.6 g/dL (ref 30.0–36.0)
MCV: 87.2 fL (ref 78.0–100.0)
Platelets: 125 10*3/uL — ABNORMAL LOW (ref 150–400)
Platelets: 117 10*3/uL — ABNORMAL LOW (ref 150–400)
RBC: 3.32 MIL/uL — ABNORMAL LOW (ref 3.87–5.11)
RDW: 13.4 % (ref 11.5–15.5)
WBC: 10.4 10*3/uL (ref 4.0–10.5)
WBC: 9.2 10*3/uL (ref 4.0–10.5)

## 2011-07-12 LAB — CREATININE, SERUM
Creatinine, Ser: 0.4 mg/dL — ABNORMAL LOW (ref 0.50–1.10)
GFR calc Af Amer: >90 mL/min (ref >90)
GFR calc non Af Amer: >90 mL/min (ref >90)

## 2011-07-12 LAB — POCT I-STAT 4, (NA,K, GLUC, HGB,HCT)
Glucose, Bld: 118 mg/dL — ABNORMAL HIGH (ref 70–99)
Glucose, Bld: 114 mg/dL — ABNORMAL HIGH (ref 70–99)
Glucose, Bld: 105 mg/dL — ABNORMAL HIGH (ref 70–99)
HCT: 36 % (ref 36.0–46.0)
HCT: 33 % — ABNORMAL LOW (ref 36.0–46.0)
HCT: 22 % — ABNORMAL LOW (ref 36.0–46.0)
HCT: 24 % — ABNORMAL LOW (ref 36.0–46.0)
HCT: 29 % — ABNORMAL LOW (ref 36.0–46.0)
Hemoglobin: 7.5 g/dL — ABNORMAL LOW (ref 12.0–15.0)
Hemoglobin: 9.9 g/dL — ABNORMAL LOW (ref 12.0–15.0)
Potassium: 4 mEq/L (ref 3.5–5.1)
Potassium: 6.9 mEq/L (ref 3.5–5.1)
Sodium: 138 mEq/L (ref 135–145)
Sodium: 141 mEq/L (ref 135–145)

## 2011-07-12 LAB — POCT I-STAT 3, ART BLOOD GAS (G3+)
Acid-base deficit: 2 mmol/L (ref 0.0–2.0)
Bicarbonate: 23.9 mEq/L (ref 20.0–24.0)
Bicarbonate: 22.5 mEq/L (ref 20.0–24.0)
Bicarbonate: 25 mEq/L — ABNORMAL HIGH (ref 20.0–24.0)
Bicarbonate: 21.7 mEq/L (ref 20.0–24.0)
O2 Saturation: 96 %
TCO2: 25 mmol/L (ref 0–100)
TCO2: 26 mmol/L (ref 0–100)
TCO2: 23 mmol/L (ref 0–100)
pCO2 arterial: 38.4 mmHg (ref 35.0–45.0)
pCO2 arterial: 43.2 mmHg (ref 35.0–45.0)
pCO2 arterial: 36.6 mmHg (ref 35.0–45.0)
pH, Arterial: 7.341 — ABNORMAL LOW (ref 7.350–7.400)
pH, Arterial: 7.372 (ref 7.350–7.400)
pO2, Arterial: 104 mmHg — ABNORMAL HIGH (ref 80.0–100.0)
pO2, Arterial: 87 mmHg (ref 80.0–100.0)

## 2011-07-12 LAB — PLATELET COUNT: Platelets: 111 10*3/uL — ABNORMAL LOW (ref 150–400)

## 2011-07-12 LAB — POCT I-STAT, CHEM 8
BUN: 6 mg/dL (ref 6–23)
Calcium, Ion: 1.16 mmol/L (ref 1.12–1.32)
Chloride: 108 mEq/L (ref 96–112)
Creatinine, Ser: 0.5 mg/dL (ref 0.50–1.10)
Glucose, Bld: 155 mg/dL — ABNORMAL HIGH (ref 70–99)

## 2011-07-12 LAB — GLUCOSE, CAPILLARY
Glucose-Capillary: 115 mg/dL — ABNORMAL HIGH (ref 70–99)
Glucose-Capillary: 90 mg/dL (ref 70–99)
Glucose-Capillary: 141 mg/dL — ABNORMAL HIGH (ref 70–99)

## 2011-07-12 LAB — MAGNESIUM: Magnesium: 3.1 mg/dL — ABNORMAL HIGH (ref 1.5–2.5)

## 2011-07-12 LAB — POCT I-STAT GLUCOSE
Glucose, Bld: 107 mg/dL — ABNORMAL HIGH (ref 70–99)
Operator id: 195151

## 2011-07-12 LAB — APTT: aPTT: 32 seconds (ref 24–37)

## 2011-07-12 SURGERY — CORONARY ARTERY BYPASS GRAFTING (CABG)TIMES FIVE
Anesthesia: General | Site: Chest | Wound class: Clean

## 2011-07-12 MED ORDER — CALCIUM CHLORIDE 10 % IV SOLN
1.0000 g | Freq: Once | INTRAVENOUS | Status: AC | PRN
  Administered 2011-07-12: 1 g via INTRAVENOUS
  Filled 2011-07-12: qty 10

## 2011-07-12 MED ORDER — INSULIN ASPART 100 UNIT/ML ~~LOC~~ SOLN
0.0000 [IU] | SUBCUTANEOUS | Status: AC
  Administered 2011-07-12 (×2): 2 [IU] via SUBCUTANEOUS
  Filled 2011-07-12: qty 3

## 2011-07-12 MED ORDER — PANTOPRAZOLE SODIUM 40 MG PO TBEC: 40.0000 mg | DELAYED_RELEASE_TABLET | Freq: Every day | ORAL | Status: DC

## 2011-07-12 MED ORDER — ACETAMINOPHEN 325 MG PO TABS: 650.0000 mg | ORAL_TABLET | ORAL | Status: AC

## 2011-07-12 MED ORDER — METOPROLOL TARTRATE 1 MG/ML IV SOLN: 2.5000 mg | INTRAVENOUS | Status: DC | PRN

## 2011-07-12 MED ORDER — DOCUSATE SODIUM 100 MG PO CAPS
200.0000 mg | ORAL_CAPSULE | Freq: Every day | ORAL | Status: DC
  Administered 2011-07-14: 200 mg via ORAL
  Filled 2011-07-12: qty 2
  Filled 2011-07-12 (×2): qty 1

## 2011-07-12 MED ORDER — FENTANYL CITRATE 0.05 MG/ML IJ SOLN
INTRAMUSCULAR | Status: DC | PRN
  Administered 2011-07-12 (×2): 250 ug via INTRAVENOUS
  Administered 2011-07-12: 50 ug via INTRAVENOUS
  Administered 2011-07-12: 450 ug via INTRAVENOUS
  Administered 2011-07-12: 250 ug via INTRAVENOUS
  Administered 2011-07-12: 50 ug via INTRAVENOUS

## 2011-07-12 MED ORDER — ACETAMINOPHEN 650 MG RE SUPP
975.0000 mg | Freq: Four times a day (QID) | RECTAL | Status: DC
  Filled 2011-07-12 (×3): qty 1

## 2011-07-12 MED ORDER — INSULIN REGULAR HUMAN 100 UNIT/ML IJ SOLN
100.0000 [IU] | INTRAMUSCULAR | Status: DC | PRN
  Administered 2011-07-12: 1.7 [IU]/h via INTRAVENOUS

## 2011-07-12 MED ORDER — VANCOMYCIN HCL 1000 MG IV SOLR
1000.0000 mg | Freq: Once | INTRAVENOUS | Status: AC
  Administered 2011-07-12: 1000 mg via INTRAVENOUS
  Filled 2011-07-12: qty 1000

## 2011-07-12 MED ORDER — MORPHINE SULFATE 4 MG/ML IJ SOLN: 2.0000 mg | INTRAMUSCULAR | Status: DC | PRN

## 2011-07-12 MED ORDER — VECURONIUM BROMIDE 10 MG IV SOLR
INTRAVENOUS | Status: DC | PRN
  Administered 2011-07-12: 10 mg via INTRAVENOUS
  Administered 2011-07-12: 5 mg via INTRAVENOUS

## 2011-07-12 MED ORDER — PLASMA-LYTE 148 IV SOLN
INTRAVENOUS | Status: AC
  Administered 2011-07-12: 11:00:00
  Filled 2011-07-12: qty 0.5

## 2011-07-12 MED ORDER — POTASSIUM CHLORIDE 10 MEQ/50ML IV SOLN
10.0000 meq | INTRAVENOUS | Status: AC
  Administered 2011-07-12 (×3): 10 meq via INTRAVENOUS

## 2011-07-12 MED ORDER — VANCOMYCIN HCL 1000 MG IV SOLR: 1000.0000 mg | Freq: Once | INTRAVENOUS | Status: DC

## 2011-07-12 MED ORDER — SODIUM CHLORIDE 0.9 % IV SOLN
INTRAVENOUS | Status: DC
  Filled 2011-07-12: qty 1

## 2011-07-12 MED ORDER — NITROGLYCERIN IN D5W 200-5 MCG/ML-% IV SOLN
INTRAVENOUS | Status: DC | PRN
  Administered 2011-07-12: 5 ug/min via INTRAVENOUS

## 2011-07-12 MED ORDER — BISACODYL 10 MG RE SUPP
10.0000 mg | Freq: Every day | RECTAL | Status: DC
  Administered 2011-07-14: 10 mg via RECTAL
  Filled 2011-07-12 (×4): qty 1

## 2011-07-12 MED ORDER — INSULIN ASPART 100 UNIT/ML ~~LOC~~ SOLN
0.0000 [IU] | SUBCUTANEOUS | Status: DC
  Administered 2011-07-13 – 2011-07-14 (×6): 2 [IU] via SUBCUTANEOUS

## 2011-07-12 MED ORDER — PHENYLEPHRINE HCL 10 MG/ML IJ SOLN
0.0000 ug/min | INTRAVENOUS | Status: DC
  Administered 2011-07-12: 16 ug/min via INTRAVENOUS
  Filled 2011-07-12: qty 2

## 2011-07-12 MED ORDER — CHLORHEXIDINE GLUCONATE 4 % EX LIQD
2.0000 "application " | Freq: Once | CUTANEOUS | Status: DC
  Filled 2011-07-12 (×2): qty 118

## 2011-07-12 MED ORDER — LACTATED RINGERS IV SOLN: 500.0000 mL | Freq: Once | INTRAVENOUS | Status: AC | PRN

## 2011-07-12 MED ORDER — SODIUM CHLORIDE 0.9 % IV SOLN
10.0000 g | INTRAVENOUS | Status: DC | PRN
  Administered 2011-07-12 (×2): 1 g/h via INTRAVENOUS

## 2011-07-12 MED ORDER — ACETAMINOPHEN 650 MG RE SUPP
650.0000 mg | RECTAL | Status: AC
  Administered 2011-07-12: 650 mg via RECTAL

## 2011-07-12 MED ORDER — HEPARIN SODIUM (PORCINE) 1000 UNIT/ML IJ SOLN
INTRAMUSCULAR | Status: DC | PRN
  Administered 2011-07-12: 2000 [IU] via INTRAVENOUS
  Administered 2011-07-12: 14000 [IU] via INTRAVENOUS

## 2011-07-12 MED ORDER — ACETAMINOPHEN 500 MG PO TABS
1000.0000 mg | ORAL_TABLET | Freq: Four times a day (QID) | ORAL | Status: DC
  Administered 2011-07-12 – 2011-07-14 (×6): 1000 mg via ORAL
  Filled 2011-07-12 (×9): qty 2

## 2011-07-12 MED ORDER — PROTAMINE SULFATE 10 MG/ML IV SOLN
INTRAVENOUS | Status: DC | PRN
  Administered 2011-07-12: 140 mg via INTRAVENOUS

## 2011-07-12 MED ORDER — ALBUMIN HUMAN 5 % IV SOLN
250.0000 mL | INTRAVENOUS | Status: AC | PRN
  Administered 2011-07-12 – 2011-07-13 (×5): 250 mL via INTRAVENOUS
  Filled 2011-07-12 (×4): qty 250

## 2011-07-12 MED ORDER — DEXTROSE 5 % IV SOLN
1.5000 g | Freq: Two times a day (BID) | INTRAVENOUS | Status: AC
  Administered 2011-07-12 – 2011-07-14 (×4): 1.5 g via INTRAVENOUS
  Filled 2011-07-12 (×4): qty 1.5

## 2011-07-12 MED ORDER — ACETAMINOPHEN 160 MG/5ML PO SOLN: 650.0000 mg | ORAL | Status: AC

## 2011-07-12 MED ORDER — DEXTROSE 5 % IV SOLN
1.5000 g | INTRAVENOUS | Status: DC | PRN
  Administered 2011-07-12: 1.5 g via INTRAVENOUS
  Administered 2011-07-12: .75 g via INTRAVENOUS

## 2011-07-12 MED ORDER — SODIUM CHLORIDE 0.9 % IV SOLN
200.0000 ug | INTRAVENOUS | Status: DC | PRN
  Administered 2011-07-12: 0.2 ug/kg/h via INTRAVENOUS

## 2011-07-12 MED ORDER — PHENYLEPHRINE HCL 10 MG/ML IJ SOLN: 30.0000 ug/min | INTRAVENOUS | Status: DC

## 2011-07-12 MED ORDER — SODIUM CHLORIDE 0.9 % IJ SOLN
OROMUCOSAL | Status: DC | PRN
  Administered 2011-07-12 (×3): via TOPICAL

## 2011-07-12 MED ORDER — CHLORHEXIDINE GLUCONATE 4 % EX LIQD
2.0000 "application " | Freq: Once | CUTANEOUS | Status: DC
  Filled 2011-07-12: qty 118

## 2011-07-12 MED ORDER — LACTATED RINGERS IV SOLN
INTRAVENOUS | Status: DC
  Administered 2011-07-12: 20 mL via INTRAVENOUS
  Administered 2011-07-13: 20 mL/h via INTRAVENOUS

## 2011-07-12 MED ORDER — SODIUM CHLORIDE 0.9 % IV SOLN
0.1000 ug/kg/h | INTRAVENOUS | Status: DC
  Filled 2011-07-12: qty 4

## 2011-07-12 MED ORDER — PHENYLEPHRINE HCL 10 MG/ML IJ SOLN
10000.0000 ug | INTRAVENOUS | Status: DC | PRN
  Administered 2011-07-12: 20 ug/min via INTRAVENOUS

## 2011-07-12 MED ORDER — PAPAVERINE HCL 30 MG/ML IJ SOLN
INTRAMUSCULAR | Status: DC | PRN
  Administered 2011-07-12: 60 mg

## 2011-07-12 MED ORDER — LIDOCAINE HCL (CARDIAC) 20 MG/ML IV SOLN
INTRAVENOUS | Status: DC | PRN
  Administered 2011-07-12: 100 mg via INTRAVENOUS

## 2011-07-12 MED ORDER — MORPHINE SULFATE 2 MG/ML IJ SOLN: 1.0000 mg | INTRAMUSCULAR | Status: DC | PRN

## 2011-07-12 MED ORDER — VANCOMYCIN HCL 1000 MG IV SOLR
1250.0000 mg | INTRAVENOUS | Status: DC | PRN
  Administered 2011-07-12: 1250 g via INTRAVENOUS

## 2011-07-12 MED ORDER — PHENYLEPHRINE HCL 10 MG/ML IJ SOLN
0.0000 ug/min | INTRAVENOUS | Status: DC
  Filled 2011-07-12: qty 2

## 2011-07-12 MED ORDER — CHLORHEXIDINE GLUCONATE 4 % EX LIQD
Freq: Once | CUTANEOUS | Status: DC
  Filled 2011-07-12: qty 60

## 2011-07-12 MED ORDER — DOPAMINE-DEXTROSE 3.2-5 MG/ML-% IV SOLN: 0.0000 ug/kg/min | INTRAVENOUS | Status: DC

## 2011-07-12 MED ORDER — MAGNESIUM SULFATE 50 % IJ SOLN
4.0000 g | Freq: Once | INTRAVENOUS | Status: AC
  Administered 2011-07-12: 4 g via INTRAVENOUS
  Filled 2011-07-12: qty 8

## 2011-07-12 MED ORDER — ACETAMINOPHEN 160 MG/5ML PO SOLN: 975.0000 mg | Freq: Four times a day (QID) | ORAL | Status: DC

## 2011-07-12 MED ORDER — OXYCODONE HCL 5 MG PO TABS
5.0000 mg | ORAL_TABLET | ORAL | Status: DC | PRN
  Administered 2011-07-12 – 2011-07-13 (×4): 5 mg via ORAL
  Administered 2011-07-14: 10 mg via ORAL
  Filled 2011-07-12: qty 2
  Filled 2011-07-12 (×4): qty 1

## 2011-07-12 MED ORDER — PROPOFOL 10 MG/ML IV EMUL
INTRAVENOUS | Status: DC | PRN
  Administered 2011-07-12: 70 mg via INTRAVENOUS

## 2011-07-12 MED ORDER — MORPHINE SULFATE 4 MG/ML IJ SOLN
INTRAMUSCULAR | Status: AC
  Filled 2011-07-12: qty 1

## 2011-07-12 MED ORDER — ALBUTEROL SULFATE (2.5 MG/3ML) 0.083% IN NEBU
INHALATION_SOLUTION | RESPIRATORY_TRACT | Status: DC | PRN
  Administered 2011-07-12: .027 mg via RESPIRATORY_TRACT

## 2011-07-12 MED ORDER — SODIUM CHLORIDE 0.9 % IJ SOLN: 3.0000 mL | INTRAMUSCULAR | Status: DC | PRN

## 2011-07-12 MED ORDER — MORPHINE SULFATE 2 MG/ML IJ SOLN: 2.0000 mg | INTRAMUSCULAR | Status: DC | PRN

## 2011-07-12 MED ORDER — NITROGLYCERIN IN D5W 200-5 MCG/ML-% IV SOLN: 0.0000 ug/min | INTRAVENOUS | Status: DC

## 2011-07-12 MED ORDER — CHLORHEXIDINE GLUCONATE 4 % EX LIQD
Freq: Once | CUTANEOUS | Status: DC
  Filled 2011-07-12: qty 15

## 2011-07-12 MED ORDER — ROCURONIUM BROMIDE 100 MG/10ML IV SOLN
INTRAVENOUS | Status: DC | PRN
  Administered 2011-07-12: 50 mg via INTRAVENOUS

## 2011-07-12 MED ORDER — MORPHINE SULFATE 4 MG/ML IJ SOLN
INTRAMUSCULAR | Status: AC
  Administered 2011-07-13: 2 mg via INTRAVENOUS
  Filled 2011-07-12: qty 1

## 2011-07-12 MED ORDER — DEXTROSE 5 % IV SOLN
0.0000 ug/min | INTRAVENOUS | Status: DC
  Administered 2011-07-12: via INTRAVENOUS
  Filled 2011-07-12: qty 2

## 2011-07-12 MED ORDER — METOPROLOL TARTRATE 25 MG/10 ML ORAL SUSPENSION
12.5000 mg | Freq: Two times a day (BID) | ORAL | Status: DC
  Administered 2011-07-13: 12.5 mg
  Filled 2011-07-12 (×5): qty 5

## 2011-07-12 MED ORDER — ACETAMINOPHEN 325 MG PO TABS: 650.0000 mg | ORAL_TABLET | ORAL | Status: DC | PRN

## 2011-07-12 MED ORDER — MORPHINE SULFATE 4 MG/ML IJ SOLN: 1.0000 mg | INTRAMUSCULAR | Status: DC | PRN

## 2011-07-12 MED ORDER — MORPHINE SULFATE 2 MG/ML IJ SOLN
1.0000 mg | INTRAMUSCULAR | Status: DC | PRN
  Administered 2011-07-12 (×2): 2 mg via INTRAVENOUS
  Filled 2011-07-12 (×2): qty 1

## 2011-07-12 MED ORDER — FAMOTIDINE IN NACL 20-0.9 MG/50ML-% IV SOLN
20.0000 mg | Freq: Two times a day (BID) | INTRAVENOUS | Status: AC
  Administered 2011-07-12: 20 mg via INTRAVENOUS

## 2011-07-12 MED ORDER — SODIUM CHLORIDE 0.45 % IV SOLN
INTRAVENOUS | Status: DC
  Administered 2011-07-12: 20 mL via INTRAVENOUS

## 2011-07-12 MED ORDER — MIDAZOLAM HCL 5 MG/5ML IJ SOLN
INTRAMUSCULAR | Status: DC | PRN
  Administered 2011-07-12 (×3): 2 mg via INTRAVENOUS
  Administered 2011-07-12: 1 mg via INTRAVENOUS

## 2011-07-12 MED ORDER — MIDAZOLAM HCL 2 MG/2ML IJ SOLN: 2.0000 mg | INTRAMUSCULAR | Status: DC | PRN

## 2011-07-12 MED ORDER — LACTATED RINGERS IV SOLN
INTRAVENOUS | Status: DC | PRN
  Administered 2011-07-12 (×2): via INTRAVENOUS

## 2011-07-12 MED ORDER — SODIUM CHLORIDE 0.9 % IV SOLN
0.1000 ug/kg/h | INTRAVENOUS | Status: AC
  Filled 2011-07-12: qty 4

## 2011-07-12 MED ORDER — LACTATED RINGERS IV SOLN
INTRAVENOUS | Status: DC | PRN
  Administered 2011-07-12 (×2): via INTRAVENOUS

## 2011-07-12 MED ORDER — SODIUM CHLORIDE 0.9 % IV SOLN: INTRAVENOUS | Status: DC

## 2011-07-12 MED ORDER — MORPHINE SULFATE 2 MG/ML IJ SOLN
2.0000 mg | INTRAMUSCULAR | Status: DC | PRN
  Administered 2011-07-12: 4 mg via INTRAVENOUS

## 2011-07-12 MED ORDER — LACTATED RINGERS IV SOLN
INTRAVENOUS | Status: DC | PRN
  Administered 2011-07-12: 07:00:00 via INTRAVENOUS

## 2011-07-12 MED ORDER — INSULIN ASPART 100 UNIT/ML ~~LOC~~ SOLN
0.0000 [IU] | SUBCUTANEOUS | Status: DC
  Filled 2011-07-12: qty 3

## 2011-07-12 MED ORDER — ALBUMIN HUMAN 5 % IV SOLN
INTRAVENOUS | Status: DC | PRN
  Administered 2011-07-12 (×2): via INTRAVENOUS

## 2011-07-12 SURGICAL SUPPLY — 103 items
ADAPTER CARDIO PERF ANTE/RETRO (ADAPTER) IMPLANT
ADPR PRFSN 84XANTGRD RTRGD (ADAPTER)
APPLIER CLIP 9.375 SM OPEN (CLIP)
APR CLP SM 9.3 20 MLT OPN (CLIP)
ATTRACTOMAT 16X20 MAGNETIC DRP (DRAPES) ×3 IMPLANT
BAG DECANTER FOR FLEXI CONT (MISCELLANEOUS) ×5 IMPLANT
BANDAGE ELASTIC 4 VELCRO ST LF (GAUZE/BANDAGES/DRESSINGS) ×3 IMPLANT
BANDAGE ELASTIC 6 VELCRO ST LF (GAUZE/BANDAGES/DRESSINGS) ×3 IMPLANT
BANDAGE GAUZE ELAST BULKY 4 IN (GAUZE/BANDAGES/DRESSINGS) ×3 IMPLANT
BASKET HEART (ORDER IN 25'S) (MISCELLANEOUS) ×1
BASKET HEART (ORDER IN 25S) (MISCELLANEOUS) ×2 IMPLANT
BLADE SAW STERNAL (BLADE) ×3 IMPLANT
BLADE SURG 10 STRL SS (BLADE) ×2 IMPLANT
BLADE SURG 11 STRL SS (BLADE) ×2 IMPLANT
CANISTER SUCTION 2500CC (MISCELLANEOUS) ×3 IMPLANT
CANN PRFSN .5XCNCT 15X34-48 (MISCELLANEOUS) ×2
CANNULA GUNDRY RCSP 15FR (MISCELLANEOUS) IMPLANT
CANNULA PRFSN .5XCNCT 15X34-48 (MISCELLANEOUS) ×2 IMPLANT
CANNULA VEN 2 STAGE (MISCELLANEOUS) ×3
CATH CPB KIT HENDRICKSON (MISCELLANEOUS) ×3 IMPLANT
CATH ROBINSON RED A/P 18FR (CATHETERS) ×6 IMPLANT
CATH THORACIC 36FR RT ANG (CATHETERS) ×2 IMPLANT
CLIP APPLIE 9.375 SM OPEN (CLIP) IMPLANT
CLIP FOGARTY SPRING 6M (CLIP) ×2 IMPLANT
CLIP TI MEDIUM 24 (CLIP) IMPLANT
CLIP TI WIDE RED SMALL 24 (CLIP) ×6 IMPLANT
CLOTH BEACON ORANGE TIMEOUT ST (SAFETY) ×3 IMPLANT
CONN Y 3/8X3/8X3/8  BEN (MISCELLANEOUS)
CONN Y 3/8X3/8X3/8 BEN (MISCELLANEOUS) IMPLANT
COVER SURGICAL LIGHT HANDLE (MISCELLANEOUS) ×6 IMPLANT
CRADLE DONUT ADULT HEAD (MISCELLANEOUS) ×3 IMPLANT
DECANTER SPIKE VIAL GLASS SM (MISCELLANEOUS) ×4 IMPLANT
DRAPE CARDIOVASCULAR INCISE (DRAPES) ×3
DRAPE SLUSH/WARMER DISC (DRAPES) ×2 IMPLANT
DRAPE SRG 135X102X78XABS (DRAPES) ×2 IMPLANT
DRSG COVADERM 4X14 (GAUZE/BANDAGES/DRESSINGS) ×3 IMPLANT
ELECT PAD GROUND ADT 9 (MISCELLANEOUS) ×4 IMPLANT
ELECT REM PT RETURN 9FT ADLT (ELECTROSURGICAL) ×6
ELECTRODE REM PT RTRN 9FT ADLT (ELECTROSURGICAL) ×4 IMPLANT
GLOVE BIO SURGEON STRL SZ 6 (GLOVE) ×12 IMPLANT
GLOVE BIO SURGEON STRL SZ 6.5 (GLOVE) IMPLANT
GLOVE BIO SURGEON STRL SZ7.5 (GLOVE) IMPLANT
GLOVE BIO SURGEON STRL SZ8 (GLOVE) ×4 IMPLANT
GLOVE BIOGEL M 7.0 STRL (GLOVE) IMPLANT
GLOVE BIOGEL M 8.0 STRL (GLOVE) IMPLANT
GLOVE BIOGEL PI IND STRL 7.0 (GLOVE) ×4 IMPLANT
GLOVE BIOGEL PI INDICATOR 7.0 (GLOVE) ×4
GLOVE EUDERMIC 7 POWDERFREE (GLOVE) ×13 IMPLANT
GOWN PREVENTION PLUS XLARGE (GOWN DISPOSABLE) ×6 IMPLANT
GOWN STRL NON-REIN LRG LVL3 (GOWN DISPOSABLE) ×20 IMPLANT
HEMOSTAT POWDER SURGIFOAM 1G (HEMOSTASIS) ×9 IMPLANT
HEMOSTAT SURGICEL 2X14 (HEMOSTASIS) ×3 IMPLANT
INSERT FOGARTY XLG (MISCELLANEOUS) IMPLANT
KIT BASIN OR (CUSTOM PROCEDURE TRAY) ×3 IMPLANT
KIT PAIN CUSTOM (MISCELLANEOUS) IMPLANT
KIT ROOM TURNOVER OR (KITS) ×3 IMPLANT
KIT SUCTION CATH 14FR (SUCTIONS) ×6 IMPLANT
KIT VASOVIEW W/TROCAR VH 2000 (KITS) ×3 IMPLANT
LINE VENT (MISCELLANEOUS) IMPLANT
MARKER GRAFT CORONARY BYPASS (MISCELLANEOUS) ×9 IMPLANT
NS IRRIG 1000ML POUR BTL (IV SOLUTION) ×19 IMPLANT
PACK OPEN HEART (CUSTOM PROCEDURE TRAY) ×3 IMPLANT
PAD ARMBOARD 7.5X6 YLW CONV (MISCELLANEOUS) ×3 IMPLANT
PENCIL BUTTON HOLSTER BLD 10FT (ELECTRODE) ×3 IMPLANT
PUNCH AORTIC ROTATE 4.0MM (MISCELLANEOUS) IMPLANT
PUNCH AORTIC ROTATE 4.5MM 8IN (MISCELLANEOUS) ×2 IMPLANT
PUNCH AORTIC ROTATE 5MM 8IN (MISCELLANEOUS) IMPLANT
SET CARDIOPLEGIA MPS 5001102 (MISCELLANEOUS) ×2 IMPLANT
SPONGE GAUZE 4X4 12PLY (GAUZE/BANDAGES/DRESSINGS) ×6 IMPLANT
SPONGE LAP 18X18 X RAY DECT (DISPOSABLE) ×4 IMPLANT
SPONGE LAP 4X18 X RAY DECT (DISPOSABLE) ×2 IMPLANT
SUT MNCRL AB 3-0 PS2 18 (SUTURE) ×4 IMPLANT
SUT MNCRL AB 4-0 PS2 18 (SUTURE) ×12 IMPLANT
SUT PROLENE 3 0 SH DA (SUTURE) ×3 IMPLANT
SUT PROLENE 4 0 RB 1 (SUTURE) ×3
SUT PROLENE 4 0 SH DA (SUTURE) ×10 IMPLANT
SUT PROLENE 4-0 RB1 .5 CRCL 36 (SUTURE) ×1 IMPLANT
SUT PROLENE 6 0 C 1 30 (SUTURE) ×10 IMPLANT
SUT PROLENE 7 0 BV 1 (SUTURE) ×4 IMPLANT
SUT PROLENE 7 0 BV1 MDA (SUTURE) ×7 IMPLANT
SUT PROLENE 8 0 BV175 6 (SUTURE) IMPLANT
SUT SILK  1 MH (SUTURE) ×1
SUT SILK 1 MH (SUTURE) ×2 IMPLANT
SUT STEEL 6MS V (SUTURE) ×2 IMPLANT
SUT STEEL STERNAL CCS#1 18IN (SUTURE) IMPLANT
SUT STEEL SZ 6 DBL 3X14 BALL (SUTURE) ×2 IMPLANT
SUT VIC AB 1 CTX 36 (SUTURE) ×6
SUT VIC AB 1 CTX36XBRD ANBCTR (SUTURE) ×4 IMPLANT
SUT VIC AB 2-0 CT1 27 (SUTURE)
SUT VIC AB 2-0 CT1 TAPERPNT 27 (SUTURE) IMPLANT
SUT VIC AB 2-0 CTX 27 (SUTURE) IMPLANT
SUT VIC AB 3-0 SH 27 (SUTURE) ×15
SUT VIC AB 3-0 SH 27X BRD (SUTURE) ×5 IMPLANT
SUT VIC AB 3-0 X1 27 (SUTURE) ×4 IMPLANT
SUT VICRYL 4-0 PS2 18IN ABS (SUTURE) IMPLANT
SUTURE E-PAK OPEN HEART (SUTURE) ×3 IMPLANT
SYSTEM SAHARA CHEST DRAIN ATS (WOUND CARE) ×3 IMPLANT
TOWEL OR 17X24 6PK STRL BLUE (TOWEL DISPOSABLE) ×3 IMPLANT
TOWEL OR 17X26 10 PK STRL BLUE (TOWEL DISPOSABLE) ×3 IMPLANT
TRAY FOLEY IC TEMP SENS 14FR (CATHETERS) ×3 IMPLANT
TUBING INSUFFLATION 10FT LAP (TUBING) ×3 IMPLANT
UNDERPAD 30X30 INCONTINENT (UNDERPADS AND DIAPERS) ×3 IMPLANT
WATER STERILE IRR 1000ML POUR (IV SOLUTION) ×6 IMPLANT

## 2011-07-12 NOTE — Transfer of Care (Signed)
Immediate Anesthesia Transfer of Care Note  Patient: [name]  Procedure(s) Performed:  CORONARY ARTERY BYPASS GRAFTING (CABG)TIMES FIVE - utilizing left internal mammary artery and bilateral saphenous veins harvested endoscopically  Patient Location: SICU  Anesthesia Type: General  Level of Consciousness: sedated and unresponsive  Airway & Oxygen Therapy: Patient remains intubated per anesthesia plan and Patient placed on Ventilator (see vital sign flow sheet for setting)  Post-op Assessment: Post -op Vital signs reviewed and stable  Post vital signs: stable  Complications: No apparent anesthesia complications

## 2011-07-12 NOTE — Anesthesia Preprocedure Evaluation (Signed)
Anesthesia Evaluation  Patient identified by MRN, date of birth, ID band Patient awake    Reviewed: Allergy & Precautions, H&P , NPO status , Patient's Chart, lab work & pertinent test results, reviewed documented beta blocker date and time   Airway Mallampati: I TM Distance: >3 FB Neck ROM: Full    Dental   Pulmonary neg pulmonary ROS,    Pulmonary exam normal       Cardiovascular hypertension, + CAD Regular Normal    Neuro/Psych  Neuromuscular disease    GI/Hepatic   Endo/Other    Renal/GU      Musculoskeletal  (+) Fibromyalgia -  Abdominal Normal abdominal exam  (+)   Peds  Hematology   Anesthesia Other Findings   Reproductive/Obstetrics                           Anesthesia Physical Anesthesia Plan  ASA: III  Anesthesia Plan: General   Post-op Pain Management:    Induction: Intravenous  Airway Management Planned: Oral ETT  Additional Equipment: Arterial line and PA Cath  Intra-op Plan:   Post-operative Plan: Post-operative intubation/ventilation  Informed Consent: I have reviewed the patients History and Physical, chart, labs and discussed the procedure including the risks, benefits and alternatives for the proposed anesthesia with the patient or authorized representative who has indicated his/her understanding and acceptance.     Plan Discussed with: CRNA and Surgeon  Anesthesia Plan Comments:         Anesthesia Quick Evaluation

## 2011-07-12 NOTE — Transfer of Care (Signed)
Immediate Anesthesia Transfer of Care Note  Patient: [name]  Procedure(s) Performed:  CORONARY ARTERY BYPASS GRAFTING (CABG)TIMES FIVE - utilizing left internal mammary artery and bilateral saphenous veins harvested endoscopically  Patient Location:   Anesthesia Type: General  Level of Consciousness: sedated  Airway & Oxygen Therapy: Patient remains intubated per anesthesia plan and Patient placed on Ventilator (see vital sign flow sheet for setting)  Post-op Assessment: Post -op Vital signs reviewed and stable  Post vital signs: stable  Complications: No apparent anesthesia complications

## 2011-07-12 NOTE — Preoperative (Signed)
Beta Blockers   Reason not to administer Beta Blockers:Not Applicable 

## 2011-07-12 NOTE — Op Note (Signed)
[name], [name]               ACCOUNT NO.:  [id number]  MEDICAL RECORD NO.:  [id number]  LOCATION:  2309                         FACILITY:  [facility]  PHYSICIAN:  Salvatore Decent. [name], M.D.DATE OF BIRTH:  04-19-1933  DATE OF PROCEDURE:  07/12/2011 DATE OF DISCHARGE:                              OPERATIVE REPORT   PREOPERATIVE DIAGNOSIS:  Severe three-vessel coronary artery disease status post non-Q-wave myocardial infarction.  POSTOPERATIVE DIAGNOSIS:  Severe three-vessel coronary artery disease status post non-Q-wave myocardial infarction.  PROCEDURE:  Median sternotomy, extracorporeal circulation, coronary artery bypass grafting x5 (left internal mammary artery to left anterior descending,       saphenous vein graft to first diagonal, sequential saphenous vein graft to obtuse marginals 1 and 2, saphenous vein graft to distal right coronary), endoscopic vein harvest right thigh, combination of endoscopic and open vein harvest from the left thigh.  SURGEON:  Salvatore Decent. [name], MD  ASSISTANT:  Al Corpus.  ANESTHESIA:  General.  FINDINGS:  LAD intramyocardial at the site of the anastomosis good quality target.  Remaining targets good quality.  Right coronary was relatively small.  Conduits were good quality.  CLINICAL NOTE:  [name] is a 77 year old woman who presented with bilateral arm pain and ruled in for a non-Q-wave myocardial infarction. She underwent cardiac catheterization where she was found to have severe three-vessel coronary artery disease and she was referred for coronary artery bypass grafting.  The patient had been given Plavix on admission and surgery was delayed to allow for Plavix washout prior to surgery to decrease the patient's risk of major bleeding complications.  The indications, risks, benefits, and alternatives were discussed in detail. [name] understood the risk, accepted them and agreed to proceed with bypass grafting.  OPERATIVE  NOTE:  [name] was brought to the preop holding area on July 12, 2011.  There, the Anesthesia Service placed a Swan-Ganz catheter and arterial blood pressure monitoring line.  She was taken to the operating room and intravenous antibiotics were administered.  She was anesthetized and intubated.  A Foley catheter was placed.  The chest, abdomen, and legs were prepped and draped in usual sterile fashion.  An incision was made in the medial aspect of the right leg at the level of the knee.  The greater saphenous vein was identified and was harvested endoscopically.  The vein bifurcated near the knee and additional vein needed to be harvested from the left thigh. Simultaneously with the vein harvest, median sternotomy was performed and the left internal mammary artery was harvested using standard technique.  It was a good quality vessel.  Heparin 2000 units was administered during the vessel harvest.  After harvesting vein from the left thigh, the vein harvested endoscopically was small and not usable. Incision was made in the left groin and the greater saphenous vein was identified and a segment of vein was taken from the left thigh.  This was somewhat smaller than the right leg and was used for the right coronary graft.  After the conduits had been harvested, the remainder of the full heparin dose was given after confirming adequate anticoagulation with ACT measurement.  The pericardium was opened.  The ascending  aorta was cannulated via concentric 2-0 Ethibond pledgeted pursestring sutures.  A dual stage venous cannula was placed via pursestring suture in the right atrial appendage.  Cardiopulmonary bypass was instituted.  The patient was cooled to 75 degrees Celsius.  The coronary arteries were inspected and anastomotic sites were chosen.  The conduits were inspected and cut to length.  A foam pad was placed in the pericardium to insulate the heart and protect the left phrenic  nerve.  A temperature probe was placed in myocardial septum and a cardioplegia cannula was placed in the ascending aorta.  The aorta was crossclamped.  The left ventricle was emptied via the aortic root vent.  Cardiac arrest then was achieved with a combination of cold antegrade blood cardioplegia and topical iced saline.  One liter of cardioplegia was administered.  There was a rapid diastolic arrest and myocardial septal cooling to 8 degrees Celsius.  The following distal anastomoses were performed.  First, a reverse saphenous vein graft was placed end-to-side to the distal right coronary.  This was a 1.5 mm target vessel.  The vein graft was of good quality.  It was anastomosed end-to-side with a running 7-0 Prolene suture.  All the anastomoses were probed proximally and distally at their completion.  At least to each vein graft cardioplegia was administered to assess flow in hemostasis.  Next, a reverse saphenous vein graft was placed end-to-side to the first diagonal branch of the LAD.  This was a large anterolateral branch, 1.5 mm diameter at the site of the anastomosis.  The vein graft was anastomosed end-to-side with a running 7-0 Prolene suture and again there was good flow and good hemostasis with cardioplegia administration.  Next, a reverse saphenous vein graft was placed sequentially to obtuse marginals 1 and 2.  A side-to-side anastomosis was performed to OM 1 and end-to-side to OM 2.  Both were done with running 7-0 Prolene sutures. There was excellent flow through this graft and good hemostasis at both anastomoses.  Additional cardioplegia was administered down the aortic root.  The left internal mammary then was brought through a window in the pericardium. Due to the patient's hyperinflation of the lungs and relatively inferior location of the heart within the chest, the mammary to LAD anastomosis had to be done more proximally than normal on the LAD.  The LAD  was intramyocardial in this region.  It was a 2-mm good quality target vessel.  The mammary was a 2-mm good quality conduit.  The end-to-side anastomosis was performed with a running 8-0 Prolene suture.  At the completion of the mammary to LAD anastomosis, the bulldog clamp was briefly removed to inspect for hemostasis.  Immediate rapid septal rewarming was noted.  The bulldog clamp was replaced.  The mammary pedicle was tacked to the epicardial surface of the heart with 6-0 Prolene sutures.  Additional cardioplegia was administered.  The cardioplegia cannula then was removed from the ascending aorta.  The proximal vein graft anastomoses were performed to 4.5 mm punch aortotomies with running 6-0 Prolene sutures.  At the completion of the final proximal anastomosis, the patient was placed in Trendelenburg position.  Lidocaine was administered.  Bulldog clamp was again removed from the left mammary artery.  The aortic root was de-aired, and the aortic crossclamp was removed.  The total crossclamp time was 80 minutes. The patient was rewarmed.  While rewarming was completed, all proximal and distal anastomoses were inspected for hemostasis and epicardial pacing wires were placed on the right  ventricle and right atrium.  DDD pacing was initiated as the patient was relatively bradycardic.  When she had rewarmed to a core temperature of 37 degrees Celsius, the lungs were reinflated taking care to ensure there was not any traction on the mammary.  The patient then weaned from cardiopulmonary bypass on the first attempt without difficulty.  Total bypass time was 114 minutes. The initial cardiac index was greater than 2 L/min/m2.  A test dose of protamine was administered and was well tolerated.  The atrial and aortic cannulae were removed.  The remainder of the protamine was administered without incident.  Chest was irrigated with warm saline.  Hemostasis was achieved.  The pericardium was  reapproximated with interrupted 3-0 silk sutures.  It came together easily without tension or kinking underlying grafts.  A left pleural and single mediastinal chest tubes were placed in separate subcostal incisions. The sternum was closed with a combination of single and double heavy gauge stainless steel wires.  The pectoralis fascia, subcutaneous tissue and skin were closed in standard fashion.  All sponge, needle, and instrument counts were correct at the end of the procedure.  The patient was taken from the operating room to the surgical intensive care unit intubated and in good condition.     Salvatore Decent [name], M.D.     SCH/MEDQ  D:  07/12/2011  T:  07/12/2011  Job:  [id number]  cc:   Thomas C. Wall, MD, [facility] Scott A. Gerda Diss, MD Ladona Horns. Mariel Sleet, MD

## 2011-07-12 NOTE — Anesthesia Postprocedure Evaluation (Signed)
  Anesthesia Post-op Note  Patient: [name]  Procedure(s) Performed:  CORONARY ARTERY BYPASS GRAFTING (CABG)TIMES FOUR  Patient Location: SICU  Anesthesia Type: General  Level of Consciousness: sedated and unresponsive  Airway and Oxygen Therapy: Patient remains intubated per anesthesia plan and Patient placed on Ventilator (see vital sign flow sheet for setting)  Post-op Pain: none  Post-op Assessment: Post-op Vital signs reviewed, Patient's Cardiovascular Status Stable, Respiratory Function Stable, Patent Airway, No signs of Nausea or vomiting and Pain level controlled  Post-op Vital Signs: stable  Complications: No apparent anesthesia complications

## 2011-07-12 NOTE — Progress Notes (Signed)
Extubated uneventfully. Neuro grossly intact. Hemodynamics stable. Chest tube output low. Urine output good. Continue routine early postop.

## 2011-07-12 NOTE — Transfer of Care (Signed)
Immediate Anesthesia Transfer of Care Note  Patient: [name]  Procedure(s) Performed:  CORONARY ARTERY BYPASS GRAFTING (CABG)TIMES FIVE - utilizing left internal mammary artery and bilateral saphenous veins harvested endoscopically  Patient Location: PACU and SICU  Anesthesia Type: General  Level of Consciousness: sedated and unresponsive  Airway & Oxygen Therapy: Patient remains intubated per anesthesia plan and Patient placed on Ventilator (see vital sign flow sheet for setting)  Post-op Assessment: Post -op Vital signs reviewed and stable  Post vital signs: stable  Complications: No apparent anesthesia complications

## 2011-07-12 NOTE — Progress Notes (Signed)
Pt extubated to [facility] after successful SICU rapid wean completed per protocol.  NIF -20/FVC 0.84, Pt has good cough and able to vocalize.  Will continue to monitor.

## 2011-07-12 NOTE — Brief Op Note (Signed)
07/07/2011 - 07/12/2011  2:09 PM  PATIENT:  [name] Seen  77 y.o. female  PRE-OPERATIVE DIAGNOSIS:  Coronary Artery Disease  POST-OPERATIVE DIAGNOSIS:  Coronary Artery Disease  PROCEDURE:  Procedure(s): CORONARY ARTERY BYPASS GRAFTING (CABG)TIMES FIVE  SURGEON:  Surgeon(s): Loreli Slot, MD  PHYSICIAN ASSISTANT:   ASSISTANTS: Ludger Nutting, surgical tech    ANESTHESIA:   general  EBL:  Total I/O In: 4388 [I.V.:3500; Blood:338; IV Piggyback:550] Out: 5400 [Urine:4000; Blood:1400]  BLOOD ADMINISTERED:none  DRAINS: 2 Chest Tube(s) in the mediastinum, L pleura   LOCAL MEDICATIONS USED:  NONE  SPECIMEN:  No Specimen  DISPOSITION OF SPECIMEN:  N/A  COUNTS:  YES  TOURNIQUET:  * No tourniquets in log *  DICTATION: .Other Dictation: Dictation Number -  PLAN OF CARE: Admit to inpatient   PATIENT DISPOSITION:  ICU - intubated and hemodynamically stable.   Delay start of Pharmacological VTE agent (>24hrs) due to surgical blood loss or risk of bleeding:  yes

## 2011-07-12 NOTE — Anesthesia Procedure Notes (Signed)
Procedures

## 2011-07-13 ENCOUNTER — Inpatient Hospital Stay (HOSPITAL_COMMUNITY): Payer: Medicare Other

## 2011-07-13 ENCOUNTER — Other Ambulatory Visit: Payer: Self-pay

## 2011-07-13 LAB — CBC
HCT: 26 % — ABNORMAL LOW (ref 36.0–46.0)
Hemoglobin: 8.1 g/dL — ABNORMAL LOW (ref 12.0–15.0)
MCH: 29.5 pg (ref 26.0–34.0)
MCHC: 34.2 g/dL (ref 30.0–36.0)
MCV: 88 fL (ref 78.0–100.0)
Platelets: 133 10*3/uL — ABNORMAL LOW (ref 150–400)
Platelets: 99 10*3/uL — ABNORMAL LOW (ref 150–400)
RBC: 2.75 MIL/uL — ABNORMAL LOW (ref 3.87–5.11)
RDW: 13.5 % (ref 11.5–15.5)
WBC: 9.8 10*3/uL (ref 4.0–10.5)
WBC: 6.3 10*3/uL (ref 4.0–10.5)

## 2011-07-13 LAB — GLUCOSE, CAPILLARY
Glucose-Capillary: 118 mg/dL — ABNORMAL HIGH (ref 70–99)
Glucose-Capillary: 131 mg/dL — ABNORMAL HIGH (ref 70–99)
Glucose-Capillary: 122 mg/dL — ABNORMAL HIGH (ref 70–99)

## 2011-07-13 LAB — POCT I-STAT, CHEM 8
Creatinine, Ser: 0.6 mg/dL (ref 0.50–1.10)
Glucose, Bld: 165 mg/dL — ABNORMAL HIGH (ref 70–99)
Hemoglobin: 8.2 g/dL — ABNORMAL LOW (ref 12.0–15.0)
Sodium: 137 mEq/L (ref 135–145)
TCO2: 23 mmol/L (ref 0–100)

## 2011-07-13 LAB — BASIC METABOLIC PANEL
Chloride: 108 mEq/L (ref 96–112)
GFR calc Af Amer: >90 mL/min (ref >90)
GFR calc non Af Amer: >90 mL/min (ref >90)
Potassium: 3.9 mEq/L (ref 3.5–5.1)
Sodium: 141 mEq/L (ref 135–145)

## 2011-07-13 LAB — MAGNESIUM: Magnesium: 2.2 mg/dL (ref 1.5–2.5)

## 2011-07-13 LAB — CREATININE, SERUM: GFR calc Af Amer: >90 mL/min (ref >90)

## 2011-07-13 MED ORDER — SODIUM CHLORIDE 0.9 % IV SOLN
1.0000 g | Freq: Once | INTRAVENOUS | Status: AC
  Administered 2011-07-13: 1 g via INTRAVENOUS
  Filled 2011-07-13: qty 10

## 2011-07-13 MED ORDER — INSULIN ASPART 100 UNIT/ML ~~LOC~~ SOLN: 0.0000 [IU] | SUBCUTANEOUS | Status: DC

## 2011-07-13 MED ORDER — ALBUMIN HUMAN 5 % IV SOLN
12.5000 g | Freq: Once | INTRAVENOUS | Status: AC
  Administered 2011-07-13: 12.5 g via INTRAVENOUS

## 2011-07-13 MED ORDER — ENOXAPARIN SODIUM 40 MG/0.4ML ~~LOC~~ SOLN
40.0000 mg | Freq: Every day | SUBCUTANEOUS | Status: DC
  Administered 2011-07-13: 40 mg via SUBCUTANEOUS
  Filled 2011-07-13 (×3): qty 0.4

## 2011-07-13 MED ORDER — POTASSIUM CHLORIDE 10 MEQ/100ML IV SOLN
10.0000 meq | INTRAVENOUS | Status: AC
  Administered 2011-07-13 (×2): 10 meq via INTRAVENOUS

## 2011-07-13 MED ORDER — PHENYLEPHRINE HCL 10 MG/ML IJ SOLN
0.0000 ug/min | INTRAVENOUS | Status: DC
  Administered 2011-07-14: 15 ug/min via INTRAVENOUS
  Filled 2011-07-13: qty 2

## 2011-07-13 MED ORDER — CALCIUM CHLORIDE 10 % IV SOLN
1.0000 g | Freq: Once | INTRAVENOUS | Status: DC
  Administered 2011-07-13: 1 g via INTRAVENOUS

## 2011-07-13 MED ORDER — POTASSIUM CHLORIDE 10 MEQ/50ML IV SOLN
INTRAVENOUS | Status: AC
  Administered 2011-07-13: 10 meq via INTRAVENOUS
  Filled 2011-07-13: qty 50

## 2011-07-13 MED ORDER — SODIUM CHLORIDE 0.9 % IV SOLN
INTRAVENOUS | Status: DC | PRN
  Filled 2011-07-13: qty 1

## 2011-07-13 MED ORDER — FUROSEMIDE 10 MG/ML IJ SOLN
20.0000 mg | Freq: Two times a day (BID) | INTRAMUSCULAR | Status: AC
  Administered 2011-07-13 (×2): 20 mg via INTRAVENOUS
  Filled 2011-07-13 (×2): qty 2

## 2011-07-13 MED ORDER — POTASSIUM CHLORIDE 10 MEQ/50ML IV SOLN
10.0000 meq | Freq: Once | INTRAVENOUS | Status: AC
  Administered 2011-07-13 – 2011-07-14 (×2): 10 meq via INTRAVENOUS
  Filled 2011-07-13: qty 50

## 2011-07-13 MED ORDER — MORPHINE SULFATE 4 MG/ML IJ SOLN
2.0000 mg | INTRAMUSCULAR | Status: DC | PRN
  Administered 2011-07-13 – 2011-07-14 (×4): 2 mg via INTRAVENOUS
  Filled 2011-07-13 (×3): qty 1

## 2011-07-13 NOTE — Progress Notes (Signed)
Met with pt to discuss discharge plans.  PTA, pt lives alone.  She requests short-term skilled nursing facility placement upon discharge.  She has a daughter, who works, and a son who has had  medical issues and will not be available to assist her at dc.  Pt's first choice is [facility] in [place].  Will need PT/OT consults--care coordinator to obtain orders.  Will consult CSW to facilitate dc to SNF when medically stable.   Jerrell Belfast, RN, BSN Pager # [phone]

## 2011-07-13 NOTE — Progress Notes (Signed)
1 Day Post-Op Procedure(s) (LRB): CORONARY ARTERY BYPASS GRAFTING (CABG)TIMES FIVE (N/A) Subjective: Feels poorly Didn't sleep well Some incisional pain  Objective: Vital signs in last 24 hours: Temp:  [95.5 F (35.3 C)-99.9 F (37.7 C)] 99.9 F (37.7 C) (11/05 2345) Pulse Rate:  [69-91] 90  (11/06 0700) Cardiac Rhythm:  [-] Atrial paced (11/06 0125) Resp:  [6-22] 16  (11/06 0700) BP: (68-114)/(41-69) 82/53 mmHg (11/06 0700) SpO2:  [94 %-100 %] 96 % (11/06 0700) Arterial Line BP: (89-134)/(43-72) 98/61 mmHg (11/06 0700) FiO2 (%):  [40 %-50 %] 40 % (11/05 1811) Weight:  [121 lb 0.5 oz (54.9 kg)-123 lb 7.3 oz (56 kg)] 121 lb 0.5 oz (54.9 kg) (11/06 0600)  Hemodynamic parameters for last 24 hours: PAP: (22-34)/(10-20) 33/18 mmHg CO:  [2 L/min-3.7 L/min] 3.7 L/min CI:  [1.3 L/min/m2-2.3 L/min/m2] 2.3 L/min/m2  Intake/Output from previous day: 11/05 0701 - 11/06 0700 In: 7325.9 [P.O.:180; I.V.:4847.9; Blood:338; IV Piggyback:1900] Out: 8860 [Urine:7070; Blood:1400; Chest Tube:390] Intake/Output this shift:    Neurologic: intact Heart: regular rate and rhythm Lungs: diminished breath sounds bibasilar Abdomen: soft nontender  Lab Results:  Basename 07/13/11 0400 07/12/11 2003 07/12/11 2000  WBC 9.8 -- 9.2  HGB 8.9* 8.8* --  HCT 26.0* 26.0* --  PLT 133* -- 117*   BMET:  Basename 07/13/11 0400 07/12/11 2003  NA 141 142  K 3.9 4.1  CL 108 108  CO2 25 --  GLUCOSE 134* 155*  BUN 8 6  CREATININE 0.44* 0.50  CALCIUM 8.2* --    PT/INR:  Basename 07/12/11 1435  LABPROT 18.3*  INR 1.49   ABG    Component Value Date/Time   PHART 7.386 07/12/2011 2003   HCO3 21.7 07/12/2011 2003   TCO2 23 07/12/2011 2003   TCO2 20 07/12/2011 2003   ACIDBASEDEF 3.0* 07/12/2011 2003   O2SAT 96.0 07/12/2011 2003   CBG (last 3)   Basename 07/13/11 0355 07/13/11 0011 07/12/11 2156  GLUCAP 131* 118* 141*    Assessment/Plan: S/P Procedure(s) (LRB): CORONARY ARTERY BYPASS GRAFTING  (CABG)TIMES FIVE (N/A) Mobilize Albumin + calcium Diurese oob D/c chest tubes D/c swan Wean Neo    LOS: 6 days    HENDRICKSON,STEVEN C 07/13/2011

## 2011-07-14 ENCOUNTER — Inpatient Hospital Stay (HOSPITAL_COMMUNITY): Payer: Medicare Other

## 2011-07-14 ENCOUNTER — Other Ambulatory Visit: Payer: Self-pay

## 2011-07-14 DIAGNOSIS — I4891 Unspecified atrial fibrillation: Secondary | ICD-10-CM

## 2011-07-14 LAB — BASIC METABOLIC PANEL
BUN: 10 mg/dL (ref 6–23)
Creatinine, Ser: 0.49 mg/dL — ABNORMAL LOW (ref 0.50–1.10)
GFR calc non Af Amer: >90 mL/min (ref >90)
Glucose, Bld: 130 mg/dL — ABNORMAL HIGH (ref 70–99)
Potassium: 3.9 mEq/L (ref 3.5–5.1)

## 2011-07-14 LAB — GLUCOSE, CAPILLARY
Glucose-Capillary: 118 mg/dL — ABNORMAL HIGH (ref 70–99)
Glucose-Capillary: 138 mg/dL — ABNORMAL HIGH (ref 70–99)
Glucose-Capillary: 129 mg/dL — ABNORMAL HIGH (ref 70–99)

## 2011-07-14 LAB — CBC
HCT: 24.3 % — ABNORMAL LOW (ref 36.0–46.0)
Hemoglobin: 8.2 g/dL — ABNORMAL LOW (ref 12.0–15.0)
MCH: 29.6 pg (ref 26.0–34.0)
MCHC: 33.7 g/dL (ref 30.0–36.0)
RDW: 14 % (ref 11.5–15.5)

## 2011-07-14 MED ORDER — ROSUVASTATIN CALCIUM 10 MG PO TABS
10.0000 mg | ORAL_TABLET | Freq: Every day | ORAL | Status: DC
  Administered 2011-07-14 – 2011-07-15 (×2): 10 mg via ORAL
  Filled 2011-07-14 (×3): qty 1

## 2011-07-14 MED ORDER — TRAMADOL HCL 50 MG PO TABS
50.0000 mg | ORAL_TABLET | ORAL | Status: DC | PRN
  Administered 2011-07-14 – 2011-07-21 (×10): 100 mg via ORAL
  Administered 2011-07-21 – 2011-07-22 (×2): 50 mg via ORAL
  Filled 2011-07-14 (×3): qty 2
  Filled 2011-07-14: qty 1
  Filled 2011-07-14 (×5): qty 2
  Filled 2011-07-14: qty 1
  Filled 2011-07-14 (×2): qty 2

## 2011-07-14 MED ORDER — ALPRAZOLAM 0.25 MG PO TABS
0.2500 mg | ORAL_TABLET | Freq: Four times a day (QID) | ORAL | Status: DC | PRN
  Administered 2011-07-16 – 2011-07-22 (×3): 0.25 mg via ORAL
  Filled 2011-07-14 (×3): qty 1

## 2011-07-14 MED ORDER — FUROSEMIDE 10 MG/ML IJ SOLN: 20.0000 mg | Freq: Once | INTRAMUSCULAR | Status: AC

## 2011-07-14 MED ORDER — DEXTROSE 5 % IV SOLN
150.0000 mg | INTRAVENOUS | Status: DC | PRN
  Filled 2011-07-14: qty 3

## 2011-07-14 MED ORDER — PANTOPRAZOLE SODIUM 40 MG PO TBEC
40.0000 mg | DELAYED_RELEASE_TABLET | Freq: Every day | ORAL | Status: DC
  Administered 2011-07-15 – 2011-07-23 (×9): 40 mg via ORAL
  Filled 2011-07-14 (×9): qty 1

## 2011-07-14 MED ORDER — POVIDONE-IODINE 10 % EX SOLN
1.0000 "application " | Freq: Two times a day (BID) | CUTANEOUS | Status: DC
  Administered 2011-07-14 – 2011-07-16 (×6): 1 via TOPICAL
  Filled 2011-07-14: qty 15

## 2011-07-14 MED ORDER — DOCUSATE SODIUM 100 MG PO CAPS
200.0000 mg | ORAL_CAPSULE | Freq: Every day | ORAL | Status: DC
  Administered 2011-07-17 – 2011-07-20 (×4): 200 mg via ORAL
  Filled 2011-07-14 (×5): qty 2

## 2011-07-14 MED ORDER — SODIUM CHLORIDE 0.9 % IJ SOLN: 3.0000 mL | INTRAMUSCULAR | Status: DC | PRN

## 2011-07-14 MED ORDER — POTASSIUM CHLORIDE CRYS ER 20 MEQ PO TBCR
20.0000 meq | EXTENDED_RELEASE_TABLET | Freq: Every day | ORAL | Status: DC
  Administered 2011-07-15: 20 meq via ORAL
  Filled 2011-07-14 (×2): qty 1

## 2011-07-14 MED ORDER — AMIODARONE LOAD VIA INFUSION
150.0000 mg | Freq: Once | INTRAVENOUS | Status: AC
  Administered 2011-07-14: 150 mg via INTRAVENOUS
  Filled 2011-07-14: qty 151.2

## 2011-07-14 MED ORDER — POTASSIUM CHLORIDE 10 MEQ/50ML IV SOLN
INTRAVENOUS | Status: AC
  Administered 2011-07-14: 10 meq
  Filled 2011-07-14: qty 50

## 2011-07-14 MED ORDER — METOPROLOL TARTRATE 12.5 MG HALF TABLET
12.5000 mg | ORAL_TABLET | Freq: Two times a day (BID) | ORAL | Status: DC
  Administered 2011-07-15 – 2011-07-16 (×3): 12.5 mg via ORAL
  Filled 2011-07-14 (×8): qty 1

## 2011-07-14 MED ORDER — INSULIN ASPART 100 UNIT/ML ~~LOC~~ SOLN
0.0000 [IU] | Freq: Three times a day (TID) | SUBCUTANEOUS | Status: DC
  Administered 2011-07-14: 2 [IU] via SUBCUTANEOUS
  Administered 2011-07-14: 4 [IU] via SUBCUTANEOUS
  Administered 2011-07-14 – 2011-07-19 (×7): 2 [IU] via SUBCUTANEOUS
  Filled 2011-07-14 (×2): qty 3

## 2011-07-14 MED ORDER — ONDANSETRON HCL 4 MG PO TABS: 4.0000 mg | ORAL_TABLET | Freq: Four times a day (QID) | ORAL | Status: DC | PRN

## 2011-07-14 MED ORDER — SODIUM CHLORIDE 0.9 % IV SOLN
INTRAVENOUS | Status: DC
  Administered 2011-07-14 – 2011-07-16 (×4): via INTRAVENOUS

## 2011-07-14 MED ORDER — PHENYLEPHRINE HCL 10 MG/ML IJ SOLN
30.0000 ug/min | INTRAVENOUS | Status: DC
  Filled 2011-07-14: qty 2

## 2011-07-14 MED ORDER — ENOXAPARIN SODIUM 30 MG/0.3ML ~~LOC~~ SOLN
30.0000 mg | SUBCUTANEOUS | Status: DC
  Administered 2011-07-14 – 2011-07-23 (×10): 30 mg via SUBCUTANEOUS
  Filled 2011-07-14 (×10): qty 0.3

## 2011-07-14 MED ORDER — ONDANSETRON HCL 4 MG/2ML IJ SOLN
4.0000 mg | Freq: Four times a day (QID) | INTRAMUSCULAR | Status: DC | PRN
  Administered 2011-07-15: 4 mg via INTRAVENOUS
  Filled 2011-07-14 (×2): qty 2

## 2011-07-14 MED ORDER — DEXTROSE 5 % IV SOLN
30.0000 mg/h | INTRAVENOUS | Status: AC
  Administered 2011-07-15 (×2): 30 mg/h via INTRAVENOUS
  Filled 2011-07-14 (×2): qty 9

## 2011-07-14 MED ORDER — ALBUMIN HUMAN 5 % IV SOLN
INTRAVENOUS | Status: AC
  Administered 2011-07-14: 12.5 g via INTRAVENOUS
  Filled 2011-07-14: qty 250

## 2011-07-14 MED ORDER — GUAIFENESIN-DM 100-10 MG/5ML PO SYRP: 15.0000 mL | ORAL_SOLUTION | ORAL | Status: DC | PRN

## 2011-07-14 MED ORDER — SODIUM CHLORIDE 0.9 % IV SOLN: 250.0000 mL | INTRAVENOUS | Status: DC

## 2011-07-14 MED ORDER — DEXTROSE 5 % IV SOLN
60.0000 mg/h | INTRAVENOUS | Status: AC
  Filled 2011-07-14: qty 9

## 2011-07-14 MED ORDER — BISACODYL 5 MG PO TBEC: 10.0000 mg | DELAYED_RELEASE_TABLET | Freq: Every day | ORAL | Status: DC | PRN

## 2011-07-14 MED ORDER — POTASSIUM CHLORIDE 10 MEQ/100ML IV SOLN
10.0000 meq | INTRAVENOUS | Status: DC
  Administered 2011-07-14 (×4): 10 meq via INTRAVENOUS

## 2011-07-14 MED ORDER — ALBUMIN HUMAN 5 % IV SOLN
12.5000 g | Freq: Once | INTRAVENOUS | Status: AC
  Administered 2011-07-14: 12.5 g via INTRAVENOUS

## 2011-07-14 MED ORDER — FUROSEMIDE 10 MG/ML IJ SOLN
INTRAMUSCULAR | Status: AC
  Administered 2011-07-14: 20 mg via INTRAVENOUS
  Filled 2011-07-14: qty 4

## 2011-07-14 MED ORDER — SODIUM CHLORIDE 0.9 % IJ SOLN
3.0000 mL | Freq: Two times a day (BID) | INTRAMUSCULAR | Status: DC
  Administered 2011-07-14 – 2011-07-15 (×2): 3 mL via INTRAVENOUS
  Administered 2011-07-15: 22:00:00 via INTRAVENOUS
  Administered 2011-07-16 (×2): 3 mL via INTRAVENOUS

## 2011-07-14 MED ORDER — POTASSIUM CHLORIDE 10 MEQ/50ML IV SOLN
INTRAVENOUS | Status: AC
  Filled 2011-07-14: qty 50

## 2011-07-14 MED ORDER — DEXTROSE 5 % IV SOLN: 1.5000 g | INTRAVENOUS | Status: DC

## 2011-07-14 MED ORDER — MAGNESIUM HYDROXIDE 400 MG/5ML PO SUSP: 30.0000 mL | Freq: Every day | ORAL | Status: DC | PRN

## 2011-07-14 MED ORDER — ALUM & MAG HYDROXIDE-SIMETH 400-400-40 MG/5ML PO SUSP
15.0000 mL | ORAL | Status: DC | PRN
  Filled 2011-07-14: qty 30

## 2011-07-14 MED ORDER — DEXTROSE 5 % IV SOLN
30.0000 ug/min | INTRAVENOUS | Status: DC
  Filled 2011-07-14: qty 1

## 2011-07-14 MED ORDER — FUROSEMIDE 40 MG PO TABS
40.0000 mg | ORAL_TABLET | Freq: Every day | ORAL | Status: DC
  Administered 2011-07-15 – 2011-07-23 (×8): 40 mg via ORAL
  Filled 2011-07-14 (×10): qty 1

## 2011-07-14 MED ORDER — ACETAMINOPHEN 325 MG PO TABS
650.0000 mg | ORAL_TABLET | Freq: Four times a day (QID) | ORAL | Status: DC | PRN
  Administered 2011-07-14: 650 mg via ORAL

## 2011-07-14 MED ORDER — BISACODYL 10 MG RE SUPP: 10.0000 mg | Freq: Every day | RECTAL | Status: DC | PRN

## 2011-07-14 MED ORDER — POTASSIUM CHLORIDE 10 MEQ/50ML IV SOLN
INTRAVENOUS | Status: AC
  Administered 2011-07-14: 10 meq via INTRAVENOUS
  Filled 2011-07-14: qty 50

## 2011-07-14 MED ORDER — ZOLPIDEM TARTRATE 5 MG PO TABS: 5.0000 mg | ORAL_TABLET | Freq: Every evening | ORAL | Status: DC | PRN

## 2011-07-14 MED ORDER — VANCOMYCIN HCL 1000 MG IV SOLR: 1250.0000 mg | INTRAVENOUS | Status: DC

## 2011-07-14 NOTE — Progress Notes (Signed)
Patient seen and examined and history reviewed. Agree with above findings and plan. Patient with rapid afib with rate of 190-200 bpm. Hypotensive with systolic pressure in the 60's. Will proceed with emergent DCCV.  [name] 07/14/2011 7:11 PM

## 2011-07-14 NOTE — Progress Notes (Signed)
Late entry: Pt converted into Afib HR 180-220 with blood pressure of 64/42, diaphoretic, pale, lethargic but arousable.  Dr. [place] was on floor and came to assess patient and determined best treatment would be DCCV.  Dr. Donata Clay called and notified of patient status before and after DCCV.  Pt did not convert after DCCV and was given Amiodarone bolus x 2 which did reduce and eventually convert pt to NSR HR 82 BP 104/68 after giving 120 mcg of Neo IV.  Pt maintained on Amiodarone drip at 1mg /min.  Orders to transfer to ICU, report called and pt transferred in stable condition in NSR. Pt family present and notified of condition.

## 2011-07-14 NOTE — Progress Notes (Signed)
UR Completed.  [name] Jane 07/14/2011  

## 2011-07-14 NOTE — Progress Notes (Signed)
Subjective: CTSP re: rapid afib. Pt Tx from 2300 today and was stable. She had a brief episode of SVT/afib but came out in a few seconds. Later, she went into rapid afib, rate approx 200. Pt very weak, SOB and otherwise feeling bad. SBP dropped to 60s. Dr [place] saw pt and agreed she needed DCCV. Pt cardioverted x 3 but did not convert. HR lower now, in 150s-160s. After amio pt briefly converted with 4th DCCV but went back into afib. Rate better on amio, pressors initiated. Objective: Filed Vitals:   07/14/11 1500 07/14/11 1515 07/14/11 1548 07/14/11 1839  BP: 115/78  133/63 68/42  Pulse:  104 107 193  Temp:   97.8 F (36.6 C)   TempSrc:   Oral   Resp:   24 20  Height:      Weight:      SpO2: 96% 96% 95% 96%   Weight change: 10 lb 2.3 oz (4.6 kg)  Intake/Output Summary (Last 24 hours) at 07/14/11 1849 Last data filed at 07/14/11 1320  Gross per 24 hour  Intake 1156.37 ml  Output   2890 ml  Net -1733.63 ml    General: awake, in  acute distress.  Heart: Rapid irregular rate and rhythm, without murmurs, rubs, gallops.  Lungs: Crackles left side, bilateral air movement.  Abdomen: Soft,positive bowel sounds.  Neuro: Grossly intact, nonfocal. Skin: Incisions without drainage.   Lab Results:  Basename 07/14/11 0400 07/13/11 1657 07/13/11 1653 07/13/11 0400  NA 136 137 -- --  K 3.9 3.6 -- --  CL 101 98 -- --  CO2 28 -- -- 25  GLUCOSE 130* 165* -- --  BUN 10 10 -- --  CREATININE 0.49* 0.60 -- --  CALCIUM 8.8 -- -- 8.2*  MG -- -- 1.7 2.2  PHOS -- -- -- --   No results found for this basename: AST:2,ALT:2,ALKPHOS:2,BILITOT:2,PROT:2,ALBUMIN:2 in the last 72 hours No results found for this basename: LIPASE:2,AMYLASE:2 in the last 72 hours  Basename 07/14/11 0400 07/13/11 1657 07/13/11 1653  WBC 10.1 -- 6.3  NEUTROABS -- -- --  HGB 8.2* 8.2* --  HCT 24.3* 24.0* --  MCV 87.7 -- 88.0  PLT 114* -- 99*   No results found for this basename:  CKTOTAL:3,CKMB:3,CKMBINDEX:3,TROPONINI:3 in the last 72 hours No results found for this basename: POCBNP:3 in the last 72 hours No results found for this basename: DDIMER:2 in the last 72 hours No results found for this basename: HGBA1C:2 in the last 72 hours No results found for this basename: CHOL:2,HDL:2,LDLCALC:2,TRIG:2,CHOLHDL:2,LDLDIRECT:2 in the last 72 hours No results found for this basename: TSH,T4TOTAL,FREET3,T3FREE,THYROIDAB in the last 72 hours No results found for this basename: VITAMINB12:2,FOLATE:2,FERRITIN:2,TIBC:2,IRON:2,RETICCTPCT:2 in the last 72 hours  Micro Results: Recent Results (from the past 240 hour(s))  MRSA PCR SCREENING     Status: Normal   Collection Time   07/07/11  5:50 AM      Component Value Range Status Comment   MRSA by PCR NEGATIVE  NEGATIVE  Final     Studies/Results: Dg Chest Portable 1 View  07/14/2011  *RADIOLOGY REPORT*  Clinical Data: Postop cardiac surgery  PORTABLE CHEST - 1 VIEW  Comparison: 07/13/2011  Findings:  There is a right IJ catheter with tip in the SVC.  There is a right subclavian catheter with tip in the cavoatrial junction.  Removal of left-sided chest tube.  Increase in bilateral pleural effusions and associated overlying atelectasis/consolidation.  IMPRESSION:  1.  Increase in pleural effusions and bibasilar atelectasis.  2.  No pneumothorax after chest tube removal.  Original Report Authenticated By: Rosealee Albee, M.D.  .    Medications:  I have reviewed the patient's current medications. Scheduled:   . albumin human  12.5 g Intravenous Once  . cefUROXime (ZINACEF) IV  1.5 g Intravenous To OR  . cefUROXime (ZINACEF) IV  1.5 g Intravenous Q12H  . dexmedetomidine (PRECEDEX) IV infusion for high rates  0.1-0.7 mcg/kg/hr Intravenous To OR  . docusate sodium  200 mg Oral Daily  . enoxaparin  30 mg Subcutaneous Q24H  . furosemide      . furosemide  20 mg Intravenous Q12H  . furosemide  20 mg Intravenous Once  .  furosemide  40 mg Oral Daily  . insulin aspart  0-24 Units Subcutaneous TID AC & HS  . metoprolol tartrate  12.5 mg Oral BID  . pantoprazole  40 mg Oral QAC breakfast  . potassium chloride  10 mEq Intravenous Q1 Hr x 4  . potassium chloride  10 mEq Intravenous Once  . potassium chloride      . potassium chloride  20 mEq Oral Daily  . povidone-iodine  1 application Topical BID  . rosuvastatin  10 mg Oral q1800  . sodium chloride  3 mL Intravenous Q12H  . DISCONTD: acetaminophen  1,000 mg Oral Q6H  . DISCONTD: aspirin EC  325 mg Oral Daily  . DISCONTD: bisacodyl  10 mg Rectal Daily  . DISCONTD: docusate sodium  200 mg Oral Daily  . DISCONTD: enoxaparin  40 mg Subcutaneous QHS  . DISCONTD: insulin aspart  0-24 Units Subcutaneous Q4H  . DISCONTD: metoprolol tartrate  12.5 mg Per Tube BID  . DISCONTD: pantoprazole  40 mg Oral Q1200  . DISCONTD: potassium chloride  10 mEq Intravenous Q1 Hr x 4  . DISCONTD: rosuvastatin  20 mg Oral QHS  . DISCONTD: vancomycin  1,250 mg Intravenous To OR     Patient Active Hospital Problem List: Rapid afib   Assessment: unstable hemodynamically with rapid rate. Needs DCCV, will add amio once BP improves. Use pressors PRN.  Coronary artery disease (07/11/2011)   Assessment: s/p CABG    Plan: per TCTS     LOS: 7 days   Theodore Demark 07/14/2011, 6:49 PM

## 2011-07-14 NOTE — Significant Event (Signed)
Pt is transferred to 2015 via wheelchair; on 2L [place] and portable telemetry. VS stable prior to transfer. Current IV access include a PIV on L. AC and Port Cath on R. Chest. Pt son notified of transfer. Report given to [facility], Charity fundraiser.

## 2011-07-14 NOTE — Progress Notes (Signed)
  Stable in SICU in NSR on amio drip following transfer from stepdown. Tirtate neo to keep BP > 90 SAP

## 2011-07-14 NOTE — Progress Notes (Signed)
SUBJECTIVE: The patient is post coronary artery bypass graft surgery which was done on Monday. She is still having significant discomfort at the surgical site. She was transferred to telemetry. She denies any dyspnea. She's been tachycardic recently in the setting of uncontrolled pain.   Filed Vitals:   07/14/11 1445 07/14/11 1500 07/14/11 1515 07/14/11 1548  BP: 118/68 115/78  133/63  Pulse: 85  104 107  Temp:    97.8 F (36.6 C)  TempSrc:    Oral  Resp:    24  Height:      Weight:      SpO2: 100% 96% 96% 95%    Intake/Output Summary (Last 24 hours) at 07/14/11 1829 Last data filed at 07/14/11 1320  Gross per 24 hour  Intake 1276.37 ml  Output   2890 ml  Net -1613.63 ml    LABS: Basic Metabolic Panel:  Basename 07/14/11 0400 07/13/11 1657 07/13/11 1653 07/13/11 0400  NA 136 137 -- --  K 3.9 3.6 -- --  CL 101 98 -- --  CO2 28 -- -- 25  GLUCOSE 130* 165* -- --  BUN 10 10 -- --  CREATININE 0.49* 0.60 -- --  CALCIUM 8.8 -- -- 8.2*  MG -- -- 1.7 2.2  PHOS -- -- -- --   Liver Function Tests: No results found for this basename: AST:2,ALT:2,ALKPHOS:2,BILITOT:2,PROT:2,ALBUMIN:2 in the last 72 hours No results found for this basename: LIPASE:2,AMYLASE:2 in the last 72 hours CBC:  Basename 07/14/11 0400 07/13/11 1657 07/13/11 1653  WBC 10.1 -- 6.3  NEUTROABS -- -- --  HGB 8.2* 8.2* --  HCT 24.3* 24.0* --  MCV 87.7 -- 88.0  PLT 114* -- 99*   Cardiac Enzymes: No results found for this basename: CKTOTAL:3,CKMB:3,CKMBINDEX:3,TROPONINI:3 in the last 72 hours BNP: No results found for this basename: POCBNP:3 in the last 72 hours D-Dimer: No results found for this basename: DDIMER:2 in the last 72 hours Hemoglobin A1C: No results found for this basename: HGBA1C in the last 72 hours Fasting Lipid Panel: No results found for this basename: CHOL,HDL,LDLCALC,TRIG,CHOLHDL,LDLDIRECT in the last 72 hours Thyroid Function Tests: No results found for this basename:  TSH,T4TOTAL,FREET3,T3FREE,THYROIDAB in the last 72 hours Anemia Panel: No results found for this basename: VITAMINB12,FOLATE,FERRITIN,TIBC,IRON,RETICCTPCT in the last 72 hours  RADIOLOGY: Dg Chest 2 View  07/10/2011  *RADIOLOGY REPORT*  Clinical Data: Scheduled for bypass surgery.  History of smoking and respiratory problems  CHEST - 2 VIEW  Comparison: 07/07/2011  Findings: The cardiac silhouette is normal in size and configuration.  The aorta is mildly uncoiled.  No mediastinal or hilar masses or adenopathy are evident.  A well-positioned right anterior chest wall Port-A-Cath is stable from the prior exam. Lungs are hyperexpanded.  Minimal scarring at the apices.  The lungs are otherwise clear.  Bony thorax is demineralized.  IMPRESSION: Findings suggest COPD.  No acute cardiopulmonary disease.  Original Report Authenticated By:    Dg Chest Portable 1 View  07/14/2011  *RADIOLOGY REPORT*  Clinical Data: Postop cardiac surgery  PORTABLE CHEST - 1 VIEW  Comparison: 07/13/2011  Findings:  There is a right IJ catheter with tip in the SVC.  There is a right subclavian catheter with tip in the cavoatrial junction.  Removal of left-sided chest tube.  Increase in bilateral pleural effusions and associated overlying atelectasis/consolidation.  IMPRESSION:  1.  Increase in pleural effusions and bibasilar atelectasis.  2.  No pneumothorax after chest tube removal.  Original Report Authenticated By: Rosealee Albee,  M.D.   Dg Chest Portable 1 View  07/13/2011  *RADIOLOGY REPORT*  Clinical Data: Postop CABG, follow-up  PORTABLE CHEST - 1 VIEW  Comparison: Portable chest x-ray of 07/12/2011  Findings: Endotracheal tube has been removed.  Swan-Ganz catheter and right central venous line remain and left chest tube is present.  No pneumothorax is seen. Basilar opacities have increased slightly consistent with increase in atelectasis and probable effusions.  IMPRESSION: Endotracheal tube removed.  Slight increase in  basilar opacities consistent with atelectasis and effusions.  Original Report Authenticated By: Juline Patch, M.D.   Dg Chest Portable 1 View  07/12/2011  *RADIOLOGY REPORT*  Clinical Data: Of CABG  PORTABLE CHEST - 1 VIEW  Comparison: Chest x-ray of 07/10/2011  Findings: The tip of the endotracheal tube is approximately 3.1 cm above the carina.  A Swan-Ganz catheter is present with the tip in the right pulmonary artery.  Left chest tube is noted and no pneumothorax is seen.  Left basilar atelectasis and probable small left effusion is present.  Mild cardiomegaly is stable.  Right central venous line tip is noted to the SVC/RA junction.  IMPRESSION:  1.  Endotracheal tube tip 3.1 cm above carina. 2.  Swan-Ganz catheter tip in right pulmonary artery. 3.  Central venous line tip in SVC/RA junction. 4.  Left chest tube.  No pneumothorax.  Original Report Authenticated By: Juline Patch, M.D.   Dg Chest Portable 1 View  07/07/2011  *RADIOLOGY REPORT*  Clinical Data: Left upper back pain  PORTABLE CHEST - 1 VIEW  Comparison: 07/06/1999 well  Findings: Port-A-Cath appears the same.  Artifact overlies the chest.  Heart size is normal.  Mediastinal shadows are unremarkable.  Lungs are clear.  No heart failure or effusion.  IMPRESSION: No active disease.  Original Report Authenticated By: Thomasenia Sales, M.D.   Dg Chest Portable 1 View  07/06/2011  *RADIOLOGY REPORT*  Clinical Data: Pain, history of ovarian cancer  PORTABLE CHEST - 1 VIEW  Comparison: 01/05/2008  Findings: Cardiomediastinal silhouette is stable.  Right central venous line is unchanged in position.  No acute infiltrate or pleural effusion.  No pulmonary edema.  IMPRESSION: No active disease.  No significant change.  Original Report Authenticated By: Natasha Mead, M.D.    PHYSICAL EXAM General: Well developed, well nourished, in no acute distress Head: Eyes PERRLA, No xanthomas.   Normal cephalic and atramatic  Lungs: Clear bilaterally to  auscultation and percussion. Heart: HRRR S1 S2, with soft S4 murmur.  Pulses are 2+ & equal.            No carotid bruit. No JVD.  No abdominal bruits. No femoral bruits. Abdomen: Bowel sounds are positive, abdomen soft and non-tender without masses or                  Hernia's noted. Msk:  Back normal, normal gait. Normal strength and tone for age. Extremities: No clubbing, cyanosis or edema.  DP +1 Neuro: Alert and oriented X 3. Psych:  Good affect, responds appropriately  TELEMETRY: Reviewed telemetry pt in sinus tachycardia with frequent PACs.:  ASSESSMENT AND PLAN: Recent non-ST elevation myocardial infarction status post coronary artery bypass graft surgery. Continue pain control. Continue small dose metoprolol and increase the dose as tolerated by blood pressure to control her sinus tachycardia and frequent PACs. Continue aspirin and Crestor.  Active Problems:  Recurrent adenocarcinoma of colon  Coronary artery disease    Lorine Bears 07/14/2011 6:29 PM

## 2011-07-14 NOTE — Progress Notes (Signed)
Occupational Therapy Evaluation Patient Details Name: [name] MRN: [id number] DOB: Jan 07, 1933 Today's Date: 07/14/2011  Problem List:  Patient Active Problem List  Diagnoses  . Recurrent adenocarcinoma of colon  . Shoulder pain, bilateral  . Abnormal EKG  . Elbow pain  . Hypertension  . Coronary artery disease    Past Medical History:  Past Medical History  Diagnosis Date  . Cancer   . Recurrent adenocarcinoma of colon 03/15/2011  . History of polymyalgia rheumatica   . Arthritis   . Fibromyalgia    Past Surgical History:  Past Surgical History  Procedure Date  . Abdominal hysterectomy   . Appendectomy   . Tonsillectomy   . Anterior fascia resection via laparotomy   . Bladder surgery Dec 2011    OT Assessment/Plan/Recommendation OT Assessment Clinical Impression Statement: Pt. will benefit from OT in the acute care setting to increase independence with ADLs and decrease burden at next venue of care OT Recommendation/Assessment: Patient will need skilled OT in the acute care venue OT Problem List: Decreased strength;Decreased activity tolerance;Impaired balance (sitting and/or standing);Decreased safety awareness OT Therapy Diagnosis : Generalized weakness;Acute pain OT Plan OT Frequency: Min 1X/week OT Treatment/Interventions: Self-care/ADL training;Therapeutic exercise;Energy conservation;DME and/or AE instruction;Therapeutic activities;Balance training;Patient/family education OT Recommendation Follow Up Recommendations: Skilled nursing facility Equipment Recommended: Defer to next venue Individuals Consulted Consulted and Agree with Results and Recommendations: Patient OT Goals Acute Rehab OT Goals OT Goal Formulation: With patient Time For Goal Achievement: 2 weeks ADL Goals Pt Will Perform Grooming: with set-up;with supervision;Standing at sink (For ~85mins without sitting rest break) ADL Goal: Grooming - Progress: Other (comment) Pt Will Perform Lower  Body Dressing: with set-up;with adaptive equipment;Sit to stand from chair ADL Goal: Lower Body Dressing - Progress: Other (comment) Pt Will Transfer to Toilet: with supervision;Stand pivot transfer;3-in-1 ADL Goal: Toilet Transfer - Progress: Other (comment) Pt Will Perform Toileting - Hygiene: with set-up;Sit to stand from 3-in-1/toilet ADL Goal: Toileting - Hygiene - Progress: Other (comment)  OT Evaluation Precautions/Restrictions  Precautions Precautions: Sternal Precaution Comments: Educated pt. on sternal precautions with ADLs  Required Braces or Orthoses: No Restrictions Weight Bearing Restrictions: No Prior Functioning Home Living Lives With: Alone Type of Home: House Additional Comments: Pt. anticipates D/C to SNF due to no assist at home. Prior Function Level of Independence: Independent with basic ADLs Able to Take Stairs?: Yes Driving: Yes Vocation: Volunteer work ADL ADL Eating/Feeding: Performed;Set up Where Assessed - Eating/Feeding: Chair Grooming: Performed;Wash/dry face;Teeth care;Brushing hair;Minimal assistance;Set up (Pt with increased fatigue needing to sit after ~2 min stand) Grooming Details (indicate cue type and reason): Pt. finished ADLs in sitting position due to fatigue with the tasks Where Assessed - Grooming: Sitting, chair;Standing at sink (Only standing at sink ~53mins before need to sit) Upper Body Bathing: Not assessed Lower Body Bathing: Not assessed Upper Body Dressing: Performed;Minimal assistance;Set up Where Assessed - Upper Body Dressing: Sitting, chair Lower Body Dressing: Simulated;Maximal assistance Lower Body Dressing Details (indicate cue type and reason): Pt. unable to complete forward bending due to sternal precautions Where Assessed - Lower Body Dressing: Sit to stand from chair Toilet Transfer: Performed;Minimal assistance Toilet Transfer Method: Stand pivot Toilet Transfer Equipment: Bedside commode Toileting - Clothing  Manipulation: Not assessed Where Assessed - Toileting Clothing Manipulation: Not assessed Toileting - Hygiene: Minimal assistance;Performed Toileting - Hygiene Details (indicate cue type and reason): Pt. unable to reach backside for thoroughness of task Where Assessed - Toileting Hygiene: Sit to stand from 3-in-1 or toilet Tub/Shower  Transfer: Not assessed Tub/Shower Transfer Method: Not assessed Equipment Used: Rolling Morais ADL Comments: Pt. fatigued during session after ambulating chair-sink of ~10' with RW and min A with +1 for line management Vision/Perception  Vision - History Baseline Vision: Wears glasses all the time Patient Visual Report: No change from baseline Vision - Assessment Eye Alignment: Within Functional Limits Vision Assessment: Vision not tested Cognition Cognition Arousal/Alertness: Awake/alert Overall Cognitive Status: Impaired Orientation Level: Oriented X4 Safety/Judgement: Decreased awareness of safety precautions;Decreased safety judgement for tasks assessed Safety/Judgement - Other Comments: Pt. not following sternal precautions and requires max verbal dues and demonstration to follow Sensation/Coordination Coordination Gross Motor Movements are Fluid and Coordinated: Yes Fine Motor Movements are Fluid and Coordinated: Yes Extremity Assessment RUE Assessment RUE Assessment: Within Functional Limits LUE Assessment LUE Assessment: Within Functional Limits (Pt. with intermittent pain due to stress in shoulder) Mobility  Bed Mobility Bed Mobility: No Transfers Sit to Stand: 4: Min assist Sit to Stand Details (indicate cue type and reason): Verbal cues to follow sternal precautions End of Session OT - End of Session Equipment Utilized During Treatment: Gait belt Activity Tolerance: Patient limited by fatigue;Patient limited by pain Patient left: in chair;with call bell in reach Nurse Communication: Mobility status for transfers General Behavior  During Session: Lethargic Cognition: WFL for tasks performed   Bonnie Roig, OTR/L 07/14/2011, 11:11 AM

## 2011-07-14 NOTE — Procedures (Signed)
Electrical Cardioversion Procedure Note [name] [id number] 12/05/1932  Procedure: Electrical Cardioversion Indications:  Atrial Fibrillation with rapid ventricular response and profound hypotension.  Procedure Details Consent: Risks of procedure as well as the alternatives and risks of each were explained to the (patient/caregiver).  Consent for procedure obtained. Time Out: Verified patient identification, verified procedure, site/side was marked, verified correct patient position, special equipment/implants available, medications/allergies/relevent history reviewed, required imaging and test results available.  Performed time out.  Patient placed on cardiac monitor, pulse oximetry, supplemental oxygen as necessary.  Sedation given: Etomidate Pacer pads placed anterior and posterior chest.  Cardioverted 4 time(s).  Cardioverted at 150Jx1, 200 joules x3, unsuccessful. Patient given 2 boluses of amiodarone 150 mg IV with subsequent conversion to NSR. BP improved to 110 mmHG   Evaluation Findings: Post procedure EKG shows: NSR Complications: None Patient did tolerate procedure well.   [name] 07/14/2011, 7:13 PM

## 2011-07-14 NOTE — Progress Notes (Signed)
Pt t/x to unit 2000 on monitor in wheel chair. Bedside report given to [facility] and she was present during arrival to unit as pt is hooked up to telemetry. Pt's son made aware of t/x via phone call.  Felipa Emory

## 2011-07-14 NOTE — Progress Notes (Signed)
Physical Therapy Evaluation Patient Details Name: [name] MRN: [id number] DOB: [birth year] Today's Date: 07/14/2011  Problem List:  Patient Active Problem List  Diagnoses  . Recurrent adenocarcinoma of colon  . Shoulder pain, bilateral  . Abnormal EKG  . Elbow pain  . Hypertension  . Coronary artery disease    Past Medical History:  Past Medical History  Diagnosis Date  . Cancer   . Recurrent adenocarcinoma of colon 03/15/2011  . History of polymyalgia rheumatica   . Arthritis   . Fibromyalgia    Past Surgical History:  Past Surgical History  Procedure Date  . Abdominal hysterectomy   . Appendectomy   . Tonsillectomy   . Anterior fascia resection via laparotomy   . Bladder surgery Dec 2011    PT Assessment/Plan/Recommendation PT Assessment Clinical Impression Statement: Pt agrees that she will likely need rehab after d/c before home due to the fact that she lives alone.  Presently she is limited by weakness and decreased activity tolerance.  Expect her to progress steadily PT Recommendation/Assessment: Patient will need skilled PT in the acute care venue PT Problem List: Decreased strength;Decreased activity tolerance;Decreased mobility;Decreased safety awareness;Decreased knowledge of use of DME;Decreased balance;Cardiopulmonary status limiting activity;Decreased knowledge of precautions Barriers to Discharge: Decreased caregiver support PT Therapy Diagnosis : Generalized weakness PT Plan PT Frequency: Min 3X/week PT Treatment/Interventions: DME instruction;Gait training;Functional mobility training;Patient/family education;Balance training PT Recommendation Follow Up Recommendations: Skilled nursing facility Equipment Recommended: Defer to next venue PT Goals  Acute Rehab PT Goals PT Goal Formulation: With patient Time For Goal Achievement: 2 weeks Pt will go Supine/Side to Sit: with supervision PT Goal: Supine/Side to Sit - Progress: Other (comment) Pt  will Transfer Sit to Stand/Stand to Sit: with supervision PT Transfer Goal: Sit to Stand/Stand to Sit - Progress: Other (comment) Pt will Transfer Bed to Chair/Chair to Bed: with supervision PT Transfer Goal: Bed to Chair/Chair to Bed - Progress: Other (comment) Pt will Ambulate: >150 feet;with supervision;with least restrictive assistive device PT Goal: Ambulate - Progress: Other (comment)  PT Evaluation Precautions/Restrictions  Precautions Precautions: Sternal Precaution Comments: Educated pt. on sternal precautions with ADLs  Required Braces or Orthoses: No Restrictions Weight Bearing Restrictions: No Prior Functioning  Home Living Lives With: Alone Type of Home: House Additional Comments: Pt. anticipates D/C to SNF due to no assist at home. 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 not following sternal precautions and requires max verbal dues and demonstration to follow Sensation/Coordination Coordination Gross Motor Movements are Fluid and Coordinated: Yes Fine Motor Movements are Fluid and Coordinated: Yes Extremity Assessment RUE Assessment RUE Assessment: Within Functional Limits LUE Assessment LUE Assessment: Within Functional Limits (Pt. with intermittent pain due to stress in shoulder) RLE Assessment RLE Assessment: Within Functional Limits LLE Assessment LLE Assessment: Within Functional Limits (Bil generally weak and decrease activity tolerance) Mobility (including Balance) Bed Mobility Bed Mobility: No Transfers Transfers: Yes Sit to Stand: 4: Min assist Sit to Stand Details (indicate cue type and reason):  Verbal cues to follow sternal precautions Ambulation/Gait Ambulation/Gait: Yes Ambulation/Gait Assistance: 4: Min assist;Other (comment) (+1 assist for lines/tubes) Ambulation/Gait Assistance Details (indicate cue type and reason): pt needed assist maneuvering the RW Ambulation Distance (Feet): 10 Feet Assistive device: Rolling Ducat;1 person hand held assist  Gait Pattern:  (generally stable but quickly fatiguing)  Posture/Postural Control Posture/Postural Control: Postural limitations Postural Limitations: weak trunk with flexed posture presumably due to fatigue Balance Balance Assessed: Yes (As pt fatigue she became mildly unsteady) Exercise    End of Session PT - End of Session Activity Tolerance: Patient limited by fatigue;Patient limited by pain Patient left: in bed;with call bell in reach General Behavior During Session: Lethargic Cognition: [facility] for tasks performed  Kendale Rembold, Eliseo Gum 07/14/2011, 11:11 AM

## 2011-07-14 NOTE — Plan of Care (Signed)
Problem: Phase III Progression Outcomes Goal: Time patient transferred to PCTU/Telemetry POD Outcome: Completed/Met Date Met:  07/14/11 1615 07/14/11

## 2011-07-15 ENCOUNTER — Inpatient Hospital Stay (HOSPITAL_COMMUNITY): Payer: Medicare Other

## 2011-07-15 LAB — COMPREHENSIVE METABOLIC PANEL
ALT: 45 U/L — ABNORMAL HIGH (ref 0–35)
AST: 56 U/L — ABNORMAL HIGH (ref 0–37)
Albumin: 3.3 g/dL — ABNORMAL LOW (ref 3.5–5.2)
Alkaline Phosphatase: 60 U/L (ref 39–117)
BUN: 15 mg/dL (ref 6–23)
CO2: 28 mEq/L (ref 19–32)
Calcium: 8.3 mg/dL — ABNORMAL LOW (ref 8.4–10.5)
Chloride: 98 mEq/L (ref 96–112)
Creatinine, Ser: 0.4 mg/dL — ABNORMAL LOW (ref 0.50–1.10)
GFR calc Af Amer: >90 mL/min (ref >90)
GFR calc non Af Amer: >90 mL/min (ref >90)
Glucose, Bld: 120 mg/dL — ABNORMAL HIGH (ref 70–99)
Potassium: 3.9 mEq/L (ref 3.5–5.1)
Sodium: 134 mEq/L — ABNORMAL LOW (ref 135–145)
Total Bilirubin: 2.2 mg/dL — ABNORMAL HIGH (ref 0.3–1.2)
Total Protein: 5.8 g/dL — ABNORMAL LOW (ref 6.0–8.3)

## 2011-07-15 LAB — GLUCOSE, CAPILLARY
Glucose-Capillary: 99 mg/dL (ref 70–99)
Glucose-Capillary: 149 mg/dL — ABNORMAL HIGH (ref 70–99)
Glucose-Capillary: 113 mg/dL — ABNORMAL HIGH (ref 70–99)

## 2011-07-15 LAB — CBC
HCT: 24.4 % — ABNORMAL LOW (ref 36.0–46.0)
Hemoglobin: 8 g/dL — ABNORMAL LOW (ref 12.0–15.0)
MCH: 29.3 pg (ref 26.0–34.0)
MCHC: 32.8 g/dL (ref 30.0–36.0)
MCV: 89.4 fL (ref 78.0–100.0)
Platelets: 135 10*3/uL — ABNORMAL LOW (ref 150–400)
RBC: 2.73 MIL/uL — ABNORMAL LOW (ref 3.87–5.11)
RDW: 14.5 % (ref 11.5–15.5)
WBC: 8.7 10*3/uL (ref 4.0–10.5)

## 2011-07-15 LAB — TSH: TSH: 2.691 u[IU]/mL (ref 0.350–4.500)

## 2011-07-15 MED ORDER — ACETAMINOPHEN 10 MG/ML IV SOLN
1000.0000 mg | Freq: Four times a day (QID) | INTRAVENOUS | Status: AC
  Administered 2011-07-15 – 2011-07-16 (×4): 1000 mg via INTRAVENOUS
  Filled 2011-07-15 (×5): qty 100

## 2011-07-15 MED ORDER — SODIUM CHLORIDE 0.9 % IJ SOLN
10.0000 mL | Freq: Two times a day (BID) | INTRAMUSCULAR | Status: DC
  Administered 2011-07-15 – 2011-07-17 (×3): 10 mL

## 2011-07-15 MED ORDER — DEXTROSE 5 % IV SOLN
30.0000 mg/h | INTRAVENOUS | Status: DC
  Administered 2011-07-16: 30 mg/h via INTRAVENOUS
  Filled 2011-07-15: qty 9

## 2011-07-15 MED ORDER — DEXTROSE 5 % IV SOLN
1.5000 g | Freq: Two times a day (BID) | INTRAVENOUS | Status: DC
  Administered 2011-07-15: 1.5 g via INTRAVENOUS
  Filled 2011-07-15 (×2): qty 1.5

## 2011-07-15 MED ORDER — SODIUM CHLORIDE 0.9 % IJ SOLN: 10.0000 mL | INTRAMUSCULAR | Status: DC | PRN

## 2011-07-15 MED FILL — Electrolyte-R (PH 7.4) Solution: INTRAVENOUS | Qty: 1000 | Status: AC

## 2011-07-15 MED FILL — Sodium Chloride IV Soln 0.9%: INTRAVENOUS | Qty: 1000 | Status: AC

## 2011-07-15 MED FILL — Heparin Sodium (Porcine) Inj 1000 Unit/ML: INTRAMUSCULAR | Qty: 30 | Status: AC

## 2011-07-15 MED FILL — Sodium Chloride Irrigation Soln 0.9%: Qty: 3000 | Status: AC

## 2011-07-15 NOTE — Progress Notes (Signed)
TCTS BRIEF SICU PROGRESS NOTE  3 Days Post-Op  S/P Procedure(s) (LRB): CORONARY ARTERY BYPASS GRAFTING (CABG)TIMES FIVE (N/A)   Stable day.  Maintaining NSR all day.  Assessment/Plan:  Continue current plan

## 2011-07-15 NOTE — Progress Notes (Signed)
PT/OT/SLP Cancellation Note  Treatment cancelled today due to medical issues with patient which prohibited therapy  Treatment cancelled today due to patient receiving procedure or test   Treatment cancelled today due to patient's refusal to participate   Treatment cancelled today due to just back in bed from the most recent walk--x

## 2011-07-15 NOTE — Progress Notes (Signed)
3 Days Post-Op Procedure(s) (LRB): CORONARY ARTERY BYPASS GRAFTING (CABG)TIMES FIVE (N/A) Subjective: I don't feel good, c/o tired and weak Some pain, not sure pain med helps Maybe some nausea, " not sure"  Objective: Vital signs in last 24 hours: Temp:  [97.4 F (36.3 C)-98.2 F (36.8 C)] 97.4 F (36.3 C) (11/08 0800) Pulse Rate:  [78-193] 82  (11/08 0800) Cardiac Rhythm:  [-] Normal sinus rhythm (11/08 0800) Resp:  [15-27] 19  (11/08 0700) BP: (68-133)/(42-78) 115/64 mmHg (11/08 0700) SpO2:  [91 %-100 %] 95 % (11/08 0800) Weight:  [61.3 kg (135 lb 2.3 oz)] 135 lb 2.3 oz (61.3 kg) (11/08 0600)  Hemodynamic parameters for last 24 hours:    Intake/Output from previous day: 11/07 0701 - 11/08 0700 In: 1162.5 [P.O.:180; I.V.:532.5; IV Piggyback:450] Out: 1790 [Urine:1790] Intake/Output this shift: Total I/O In: 30 [P.O.:30] Out: 50 [Urine:50]  General appearance: no distress Neurologic: intact Heart: regular rate and rhythm Lungs: diminished breath sounds bibasilar Abdomen: soft, nontender Wound: clean and dry  Lab Results:  Basename 07/15/11 0502 07/14/11 0400  WBC 8.7 10.1  HGB 8.0* 8.2*  HCT 24.4* 24.3*  PLT 135* 114*   BMET:  Basename 07/15/11 0502 07/14/11 0400  NA 134* 136  K 3.9 3.9  CL 98 101  CO2 28 28  GLUCOSE 120* 130*  BUN 15 10  CREATININE 0.40* 0.49*  CALCIUM 8.3* 8.8    PT/INR:  Basename 07/12/11 1435  LABPROT 18.3*  INR 1.49   ABG    Component Value Date/Time   PHART 7.386 07/12/2011 2003   HCO3 21.7 07/12/2011 2003   TCO2 23 07/13/2011 1657   ACIDBASEDEF 3.0* 07/12/2011 2003   O2SAT 96.0 07/12/2011 2003   CBG (last 3)   Basename 07/15/11 0732 07/14/11 1936 07/14/11 1626  GLUCAP 99 183* 129*    Assessment/Plan: S/P Procedure(s) (LRB): CORONARY ARTERY BYPASS GRAFTING (CABG)TIMES FIVE (N/A) Mobilize Diuresis continue IV amiodarone for A fib   LOS: 8 days    HENDRICKSON,STEVEN C 07/15/2011

## 2011-07-15 NOTE — Progress Notes (Signed)
Pt has bed offer from stated  S[facility] preference[facility]. CSW will continue to follow for d/c planning when pt is medically cleared.

## 2011-07-16 ENCOUNTER — Encounter (HOSPITAL_COMMUNITY): Payer: Self-pay | Admitting: Thoracic Surgery (Cardiothoracic Vascular Surgery)

## 2011-07-16 ENCOUNTER — Inpatient Hospital Stay (HOSPITAL_COMMUNITY): Payer: Medicare Other

## 2011-07-16 LAB — GLUCOSE, CAPILLARY
Glucose-Capillary: 143 mg/dL — ABNORMAL HIGH (ref 70–99)
Glucose-Capillary: 116 mg/dL — ABNORMAL HIGH (ref 70–99)
Glucose-Capillary: 122 mg/dL — ABNORMAL HIGH (ref 70–99)
Glucose-Capillary: 72 mg/dL (ref 70–99)

## 2011-07-16 LAB — BASIC METABOLIC PANEL
BUN: 11 mg/dL (ref 6–23)
CO2: 27 mEq/L (ref 19–32)
Chloride: 101 mEq/L (ref 96–112)
Creatinine, Ser: 0.43 mg/dL — ABNORMAL LOW (ref 0.50–1.10)
GFR calc Af Amer: >90 mL/min (ref >90)
Glucose, Bld: 184 mg/dL — ABNORMAL HIGH (ref 70–99)
Potassium: 3.8 mEq/L (ref 3.5–5.1)

## 2011-07-16 LAB — CBC
HCT: 24 % — ABNORMAL LOW (ref 36.0–46.0)
Hemoglobin: 7.7 g/dL — ABNORMAL LOW (ref 12.0–15.0)
MCV: 89.6 fL (ref 78.0–100.0)
RBC: 2.68 MIL/uL — ABNORMAL LOW (ref 3.87–5.11)
WBC: 6.9 10*3/uL (ref 4.0–10.5)

## 2011-07-16 MED ORDER — POTASSIUM CHLORIDE 10 MEQ PO TBCR
20.0000 meq | EXTENDED_RELEASE_TABLET | Freq: Every day | ORAL | Status: DC
  Administered 2011-07-16 – 2011-07-23 (×6): 20 meq via ORAL
  Filled 2011-07-16 (×7): qty 2

## 2011-07-16 MED ORDER — AMIODARONE HCL 200 MG PO TABS
400.0000 mg | ORAL_TABLET | Freq: Two times a day (BID) | ORAL | Status: DC
  Administered 2011-07-16 – 2011-07-20 (×9): 400 mg via ORAL
  Filled 2011-07-16 (×11): qty 2

## 2011-07-16 MED ORDER — ZOLPIDEM TARTRATE 5 MG PO TABS: 5.0000 mg | ORAL_TABLET | Freq: Every evening | ORAL | Status: DC | PRN

## 2011-07-16 MED ORDER — POTASSIUM CHLORIDE CRYS ER 20 MEQ PO TBCR
40.0000 meq | EXTENDED_RELEASE_TABLET | Freq: Every day | ORAL | Status: DC
  Administered 2011-07-16: 40 meq via ORAL
  Administered 2011-07-17: 20 meq via ORAL
  Administered 2011-07-18 – 2011-07-19 (×2): 40 meq via ORAL
  Filled 2011-07-16: qty 2
  Filled 2011-07-16: qty 1
  Filled 2011-07-16 (×3): qty 2

## 2011-07-16 MED ORDER — DEXTROSE 5 % IV SOLN
150.0000 mg | Freq: Once | INTRAVENOUS | Status: AC
  Administered 2011-07-16: 150 mg via INTRAVENOUS

## 2011-07-16 MED ORDER — DEXTROSE 5 % IV SOLN
150.0000 mg | Freq: Once | INTRAVENOUS | Status: AC
  Administered 2011-07-16: 150 mg via INTRAVENOUS
  Filled 2011-07-16: qty 3

## 2011-07-16 MED ORDER — FUROSEMIDE 10 MG/ML IJ SOLN
40.0000 mg | Freq: Once | INTRAMUSCULAR | Status: AC
  Administered 2011-07-16: 40 mg via INTRAVENOUS
  Filled 2011-07-16: qty 4

## 2011-07-16 NOTE — Progress Notes (Signed)
Short runs a-fib,will load amiodorone 150mg  IV and supp po KCL.

## 2011-07-16 NOTE — Progress Notes (Signed)
4 Days Post-Op Procedure(s) (LRB): CORONARY ARTERY BYPASS GRAFTING (CABG)TIMES FIVE (N/A) Subjective: Still feels poorly. Very nonspecific. Pain controlled with meds, mild nausea, + BM  Objective: Vital signs in last 24 hours: Temp:  [97.5 F (36.4 [name])-98.4 F (36.9 [name])] 98.4 F (36.9 [name]) (11/09 0800) Pulse Rate:  [62-91] 78  (11/09 0700) Cardiac Rhythm:  [-] Normal sinus rhythm (11/09 0800) Resp:  [13-23] 16  (11/09 0700) BP: (85-120)/(55-78) 110/62 mmHg (11/09 0700) SpO2:  [92 %-99 %] 97 % (11/09 0700) Weight:  [61.1 kg (134 lb 11.2 oz)] 134 lb 11.2 oz (61.1 kg) (11/09 0600)  Hemodynamic parameters for last 24 hours:    Intake/Output from previous day: 11/08 0701 - 11/09 0700 In: 1952.8 [P.O.:630; I.V.:870.8; IV Piggyback:452] Out: 2051 [Urine:2050; Stool:1] Intake/Output this shift: Total I/O In: -  Out: 155 [Urine:155]  General appearance: alert Neurologic: intact Heart: regular rate and rhythm Lungs: diminished breath sounds bibasilar and bilaterally Abdomen: normal findings: soft, non-tender Extremities: no edema Wound: clean and dry  Lab Results:  Basename 07/16/11 0505 07/15/11 0502  WBC 6.9 8.7  HGB 7.7* 8.0*  HCT 24.0* 24.4*  PLT 180 135*   BMET:  Basename 07/16/11 0505 07/15/11 0502  NA 137 134*  K 3.8 3.9  CL 101 98  CO2 27 28  GLUCOSE 184* 120*  BUN 11 15  CREATININE 0.43* 0.40*  CALCIUM 8.4 8.3*    PT/INR: No results found for this basename: LABPROT,INR in the last 72 hours ABG    Component Value Date/Time   PHART 7.386 07/12/2011 2003   HCO3 21.7 07/12/2011 2003   TCO2 23 07/13/2011 1657   ACIDBASEDEF 3.0* 07/12/2011 2003   O2SAT 96.0 07/12/2011 2003   CBG (last 3)   Basename 07/16/11 0739 07/15/11 2123 07/15/11 1658  GLUCAP 143* 113* 148*    Assessment/Plan: S/P Procedure(s) (LRB): CORONARY ARTERY BYPASS GRAFTING (CABG)TIMES FIVE (N/A) Very slow to progress Cardiac- A fib- convert to po amio, cont lopressor Pulm- Bilateral pleural  effusions, diurese as BP allows Anemia sec to acute blood loss, symptomatic and not tolerating well- transfuse Ambulated better yesterday   LOS: 9 days    [name] 07/16/2011

## 2011-07-16 NOTE — Progress Notes (Signed)
Pt having ectopy with bursts of atrial fibrillation lasting less than 30 seconds starting at 1600. Pt asymptomatic with events and vital signs stable. MD [name] made aware- new orders received. Will continue to monitor [name]

## 2011-07-16 NOTE — Significant Event (Signed)
Pt has completed receiving 1 unit of PRBC. VS stable prior, during, and post-transfusion. No noted reactions. Will continue to monitor. Briannon Boggio, Charity fundraiser.

## 2011-07-17 ENCOUNTER — Inpatient Hospital Stay (HOSPITAL_COMMUNITY): Payer: Medicare Other

## 2011-07-17 LAB — GLUCOSE, CAPILLARY
Glucose-Capillary: 120 mg/dL — ABNORMAL HIGH (ref 70–99)
Glucose-Capillary: 131 mg/dL — ABNORMAL HIGH (ref 70–99)
Glucose-Capillary: 113 mg/dL — ABNORMAL HIGH (ref 70–99)
Glucose-Capillary: 100 mg/dL — ABNORMAL HIGH (ref 70–99)

## 2011-07-17 LAB — CBC
Platelets: 209 10*3/uL (ref 150–400)
RBC: 3.09 MIL/uL — ABNORMAL LOW (ref 3.87–5.11)
RDW: 15.2 % (ref 11.5–15.5)
WBC: 9 10*3/uL (ref 4.0–10.5)

## 2011-07-17 LAB — BASIC METABOLIC PANEL
CO2: 30 mEq/L (ref 19–32)
Chloride: 97 mEq/L (ref 96–112)
GFR calc Af Amer: >90 mL/min (ref >90)
Potassium: 4.9 mEq/L (ref 3.5–5.1)

## 2011-07-17 LAB — TYPE AND SCREEN
Antibody Screen: NEGATIVE
Unit division: 0

## 2011-07-17 LAB — APTT: aPTT: 32 seconds (ref 24–37)

## 2011-07-17 MED ORDER — SODIUM CHLORIDE 0.9 % IJ SOLN: 3.0000 mL | INTRAMUSCULAR | Status: DC | PRN

## 2011-07-17 MED ORDER — ALPRAZOLAM 0.25 MG PO TABS: 0.2500 mg | ORAL_TABLET | Freq: Four times a day (QID) | ORAL | Status: DC | PRN

## 2011-07-17 MED ORDER — SODIUM CHLORIDE 0.9 % IJ SOLN
3.0000 mL | Freq: Two times a day (BID) | INTRAMUSCULAR | Status: DC
  Administered 2011-07-17 – 2011-07-22 (×8): 3 mL via INTRAVENOUS

## 2011-07-17 MED ORDER — BISACODYL 10 MG RE SUPP: 10.0000 mg | Freq: Every day | RECTAL | Status: DC | PRN

## 2011-07-17 MED ORDER — OXYCODONE HCL 5 MG PO TABS
5.0000 mg | ORAL_TABLET | ORAL | Status: DC | PRN
  Administered 2011-07-18: 5 mg via ORAL
  Filled 2011-07-17: qty 1

## 2011-07-17 MED ORDER — PANTOPRAZOLE SODIUM 40 MG PO TBEC: 40.0000 mg | DELAYED_RELEASE_TABLET | Freq: Every day | ORAL | Status: DC

## 2011-07-17 MED ORDER — FUROSEMIDE 10 MG/ML IJ SOLN: 20.0000 mg | Freq: Once | INTRAMUSCULAR | Status: DC

## 2011-07-17 MED ORDER — POVIDONE-IODINE 10 % EX SOLN
1.0000 "application " | Freq: Two times a day (BID) | CUTANEOUS | Status: DC
  Administered 2011-07-17 – 2011-07-23 (×12): 1 via TOPICAL
  Filled 2011-07-17: qty 15

## 2011-07-17 MED ORDER — ONDANSETRON HCL 4 MG/2ML IJ SOLN: 4.0000 mg | Freq: Four times a day (QID) | INTRAMUSCULAR | Status: DC | PRN

## 2011-07-17 MED ORDER — MOVING RIGHT ALONG BOOK
Freq: Once | Status: AC
  Administered 2011-07-20: 19:00:00
  Filled 2011-07-17: qty 1

## 2011-07-17 MED ORDER — DOCUSATE SODIUM 100 MG PO CAPS: 200.0000 mg | ORAL_CAPSULE | Freq: Every day | ORAL | Status: DC

## 2011-07-17 MED ORDER — MAGNESIUM HYDROXIDE 400 MG/5ML PO SUSP: 30.0000 mL | Freq: Every day | ORAL | Status: DC | PRN

## 2011-07-17 MED ORDER — POTASSIUM CHLORIDE CRYS ER 20 MEQ PO TBCR: 20.0000 meq | EXTENDED_RELEASE_TABLET | Freq: Every day | ORAL | Status: DC

## 2011-07-17 MED ORDER — ASPIRIN EC 325 MG PO TBEC
325.0000 mg | DELAYED_RELEASE_TABLET | Freq: Every day | ORAL | Status: DC
  Administered 2011-07-18 – 2011-07-23 (×6): 325 mg via ORAL
  Filled 2011-07-17 (×6): qty 1

## 2011-07-17 MED ORDER — ALUM & MAG HYDROXIDE-SIMETH 400-400-40 MG/5ML PO SUSP: 15.0000 mL | ORAL | Status: DC | PRN

## 2011-07-17 MED ORDER — SIMVASTATIN 20 MG PO TABS
20.0000 mg | ORAL_TABLET | Freq: Every day | ORAL | Status: DC
  Administered 2011-07-17 – 2011-07-22 (×6): 20 mg via ORAL
  Filled 2011-07-17 (×8): qty 1

## 2011-07-17 MED ORDER — METOPROLOL TARTRATE 12.5 MG HALF TABLET
12.5000 mg | ORAL_TABLET | Freq: Two times a day (BID) | ORAL | Status: DC
  Administered 2011-07-17 – 2011-07-23 (×12): 12.5 mg via ORAL
  Filled 2011-07-17 (×13): qty 1

## 2011-07-17 MED ORDER — ALBUMIN HUMAN 5 % IV SOLN
INTRAVENOUS | Status: AC
  Administered 2011-07-17: 12.5 g via INTRAVENOUS
  Filled 2011-07-17: qty 250

## 2011-07-17 MED ORDER — TRAMADOL HCL 50 MG PO TABS: 50.0000 mg | ORAL_TABLET | ORAL | Status: DC | PRN

## 2011-07-17 MED ORDER — ONDANSETRON HCL 4 MG PO TABS: 4.0000 mg | ORAL_TABLET | Freq: Four times a day (QID) | ORAL | Status: DC | PRN

## 2011-07-17 MED ORDER — ALBUMIN HUMAN 5 % IV SOLN
12.5000 g | Freq: Once | INTRAVENOUS | Status: AC
  Administered 2011-07-17: 12.5 g via INTRAVENOUS

## 2011-07-17 MED ORDER — FUROSEMIDE 40 MG PO TABS: 40.0000 mg | ORAL_TABLET | Freq: Every day | ORAL | Status: DC

## 2011-07-17 MED ORDER — ZOLPIDEM TARTRATE 5 MG PO TABS: 5.0000 mg | ORAL_TABLET | Freq: Every evening | ORAL | Status: DC | PRN

## 2011-07-17 MED ORDER — BISACODYL 5 MG PO TBEC: 10.0000 mg | DELAYED_RELEASE_TABLET | Freq: Every day | ORAL | Status: DC | PRN

## 2011-07-17 NOTE — Progress Notes (Signed)
CRITICAL VALUE ALERT  Critical value received:  Low cbg  Date of notification: 11/10/2012Time of notification: 1202 Critical value read back:yes  Nurse who received alert:  Dahlia Bailiff  MD notified (1st page): Donata Clay  Time of first page:  1205  MD notified (2nd page):  Time of second page:  Responding MD:  Zenaida Niece trigt Time MD responded:  1220

## 2011-07-17 NOTE — Progress Notes (Signed)
CBG repeated 15 minutes after 8oz of orange juice was 136. No coverage given.

## 2011-07-18 ENCOUNTER — Encounter (HOSPITAL_COMMUNITY): Payer: Self-pay | Admitting: Oncology

## 2011-07-18 ENCOUNTER — Inpatient Hospital Stay (HOSPITAL_COMMUNITY): Payer: Medicare Other

## 2011-07-18 DIAGNOSIS — I2510 Atherosclerotic heart disease of native coronary artery without angina pectoris: Secondary | ICD-10-CM

## 2011-07-18 DIAGNOSIS — C189 Malignant neoplasm of colon, unspecified: Secondary | ICD-10-CM

## 2011-07-18 DIAGNOSIS — D649 Anemia, unspecified: Secondary | ICD-10-CM

## 2011-07-18 HISTORY — DX: Anemia, unspecified: D64.9

## 2011-07-18 LAB — BASIC METABOLIC PANEL
CO2: 30 mEq/L (ref 19–32)
Calcium: 8.9 mg/dL (ref 8.4–10.5)
Chloride: 95 mEq/L — ABNORMAL LOW (ref 96–112)
Glucose, Bld: 119 mg/dL — ABNORMAL HIGH (ref 70–99)
Potassium: 4 mEq/L (ref 3.5–5.1)
Sodium: 133 mEq/L — ABNORMAL LOW (ref 135–145)

## 2011-07-18 LAB — GLUCOSE, CAPILLARY
Glucose-Capillary: 105 mg/dL — ABNORMAL HIGH (ref 70–99)
Glucose-Capillary: 136 mg/dL — ABNORMAL HIGH (ref 70–99)
Glucose-Capillary: 55 mg/dL — ABNORMAL LOW (ref 70–99)
Glucose-Capillary: 77 mg/dL (ref 70–99)
Glucose-Capillary: 102 mg/dL — ABNORMAL HIGH (ref 70–99)

## 2011-07-18 LAB — CBC
Hemoglobin: 9.1 g/dL — ABNORMAL LOW (ref 12.0–15.0)
MCH: 29 pg (ref 26.0–34.0)
Platelets: 238 10*3/uL (ref 150–400)
RBC: 3.14 MIL/uL — ABNORMAL LOW (ref 3.87–5.11)
WBC: 8 10*3/uL (ref 4.0–10.5)

## 2011-07-18 NOTE — Progress Notes (Signed)
Subjective: Patient is seen in followup today. She is now status post coronary artery bypass graft. She has come out of the intensive care unit and onto the regular cardiology floor. Overall she seems to be doing quite well and I am very pleased with her progress. She of course is feeling quite tired and fatigued. Review and her hemoglobin hematocrit aren't are low. Her hemoglobin today is 9.1 with a hematocrit of 28.1. Platelets are normal.  Objective:  Most recent Vital signs: Blood pressure 112/61, pulse 85, temperature 97.3 F (36.3 C), temperature source Oral, resp. rate 19, height 5\' 5"  (1.651 m), weight 132 lb (59.875 kg), SpO2 91.00%. 5\' 5"  (1.651 m)    Body surface area is 1.66 meters squared.  Vital signs in last 24 hours: Temp:  [97.3 F (36.3 C)-100.1 F (37.8 C)] 97.3 F (36.3 C) (11/11 0454) Pulse Rate:  [59-104] 85  (11/11 0454) Resp:  [14-19] 19  (11/11 0454) BP: (111-129)/(61-85) 112/61 mmHg (11/11 0454) SpO2:  [91 %-95 %] 91 % (11/11 0454) Weight:  [132 lb (59.875 kg)] 132 lb (59.875 kg) (11/11 0454)  Intake/Output from previous day: 11/10 0701 - 11/11 0700 In: 460 [P.O.:360; I.V.:100] Out: 3100 [Urine:3100]  Physical Exam: General appearance: alert, cooperative, appears stated age, mild distress and pale Resp: clear to auscultation bilaterally and normal percussion bilaterally Chest wall: no tenderness, Patient is noted to have scar from her recent surgery. It is tender it is however healing quite well there is no evidence of bleeding Cardio: regular rate and rhythm, S1, S2 normal, no murmur, click, rub or gallop and normal apical impulse GI: soft, non-tender; bowel sounds normal; no masses,  no organomegaly Extremities: extremities normal, atraumatic, no cyanosis or edema  Lab Results:   Basename 07/18/11 0500 07/17/11 0459  WBC 8.0 9.0  HGB 9.1* 9.0*  HCT 28.1* 27.5*  PLT 238 209   BMET:  Basename 07/18/11 0500 07/17/11 0459  NA 133* 134*  K 4.0 4.9   CL 95* 97  CO2 30 30  GLUCOSE 119* 136*  BUN 12 10  CREATININE 0.43* 0.40*  CALCIUM 8.9 8.6   CMP:     Component Value Date/Time   NA 133* 07/18/2011 0500   K 4.0 07/18/2011 0500   CL 95* 07/18/2011 0500   CO2 30 07/18/2011 0500   GLUCOSE 119* 07/18/2011 0500   BUN 12 07/18/2011 0500   CREATININE 0.43* 07/18/2011 0500   CALCIUM 8.9 07/18/2011 0500   PROT 5.8* 07/15/2011 0502   ALBUMIN 3.3* 07/15/2011 0502   AST 56* 07/15/2011 0502   ALT 45* 07/15/2011 0502   ALKPHOS 60 07/15/2011 0502   BILITOT 2.2* 07/15/2011 0502   GFRNONAA >90 07/18/2011 0500   GFRAA >90 07/18/2011 0500   Micro: Results for orders placed during the hospital encounter of 07/07/11  MRSA PCR SCREENING     Status: Normal   Collection Time   07/07/11  5:50 AM      Component Value Range Status Comment   MRSA by PCR NEGATIVE  NEGATIVE  Final          Studies/Results: Dg Chest 2 View  07/18/2011  *RADIOLOGY REPORT*  Clinical Data: Postop CABG, weakness  CHEST - 2 VIEW  Comparison: 07/17/2011  Findings: Moderate left and small right pleural effusions. Associated lower lobe atelectasis.  No pneumothorax.  Stable right chest port.  Stable cardiomegaly. Postsurgical changes related to prior CABG.  IMPRESSION: Moderate left and small right pleural effusions.  No pneumothorax.  Original  Report Authenticated By: Charline Bills, M.D.   Dg Chest Portable 1 View  07/17/2011  *RADIOLOGY REPORT*  Clinical Data: Post heart surgery  PORTABLE CHEST - 1 VIEW  Comparison: 07/16/2011; 07/15/2011; 07/14/2011  Findings: Unchanged enlarged cardiac silhouette and mediastinal contours post median sternotomy and CABG.  Stable position of support apparatus.  Grossly unchanged layering bilateral effusions and bibasilar consolidative / heterogeneous opacities, left greater than right.  There is persistent cephalization of flow.  Grossly unchanged bones.  IMPRESSION: Grossly unchanged findings of pulmonary edema, bilateral effusions and  bibasilar consolidative / heterogeneous opacities, left greater than right, possibly atelectasis though underlying infection is not excluded.  Original Report Authenticated By: Waynard Reeds, M.D.    Medications: I have reviewed the patient's current medications.  Assessment/Plan: 77 y.o. female with  #31metastatic adenocarcinoma of the colon. And has received chemotherapy. She moved by most recent CT PET scan did not have any evidence of disease. She is followed by Dr. Glenford Peers had any patent. Once patient is discharged she will follow up with him.  #2 patient is status post CABG. She has done extremely well.  #3 anemia secondary to acute blood loss likely. Patient will continue to be transfused as needed.  #4 I willcontinue to  follow with you during this patient's hospitalization.    LOS: 11 days   Drue Second, MD Medical/Oncology [facility] [phone] (beeper) [phone] (Office)  07/18/2011, 9:53 AM

## 2011-07-18 NOTE — Progress Notes (Signed)
Notified MD re: 07/18/2011 6:14 PM Orders Received: Lilia Argue, RN, CC

## 2011-07-18 NOTE — Progress Notes (Addendum)
6 Days Post-Op  Procedure(s) (LRB): CORONARY ARTERY BYPASS GRAFTING (CABG)TIMES FIVE (N/A) Subjective: She feels exhausted. She got minimal sleep last night. She denies significant shortness of breath. She is having only mild pain. She has a fair appetite.  Objective  Telemetry episodes of SVT to 140's, appears to be afin, currently nsr  Temp:  [97.3 F (36.3 C)-100.1 F (37.8 C)] 97.3 F (36.3 C) (11/11 0454) Pulse Rate:  [59-104] 85  (11/11 0454) Resp:  [14-19] 19  (11/11 0454) BP: (111-129)/(61-85) 112/61 mmHg (11/11 0454) SpO2:  [91 %-95 %] 91 % (11/11 0454) Weight:  [132 lb (59.875 kg)] 132 lb (59.875 kg) (11/11 0454)   Intake/Output Summary (Last 24 hours) at 07/18/11 0838 Last data filed at 07/18/11 0806  Gross per 24 hour  Intake    320 ml  Output   3250 ml  Net  -2930 ml    Blood sugars well controlled. They ranged from 100-113 over the past 24 hours.   General appearance: alert, fatigued and no distress Neurologic: intact Heart: regular rate and rhythm, S1, S2 normal, no murmur, click, rub or gallop Lungs: Coarse expiratory breath sounds. Abdomen: soft, non-tender; bowel sounds normal; no masses,  no organomegaly Extremities: No edema Wound: Healing well without evidence of infection.  Lab Results:  Basename 07/18/11 0500 07/17/11 0459  NA 133* 134*  K 4.0 4.9  CL 95* 97  CO2 30 30  GLUCOSE 119* 136*  BUN 12 10  CREATININE 0.43* 0.40*  CALCIUM 8.9 8.6  MG -- --  PHOS -- --   No results found for this basename: AST:2,ALT:2,ALKPHOS:2,BILITOT:2,PROT:2,ALBUMIN:2 in the last 72 hours No results found for this basename: LIPASE:2,AMYLASE:2 in the last 72 hours  Basename 07/18/11 0500 07/17/11 0459  WBC 8.0 9.0  NEUTROABS -- --  HGB 9.1* 9.0*  HCT 28.1* 27.5*  MCV 89.5 89.0  PLT 238 209   No results found for this basename: CKTOTAL:4,CKMB:4,TROPONINI:4 in the last 72 hours No results found for this basename: POCBNP:3 in the last 72 hours No results  found for this basename: DDIMER in the last 72 hours No results found for this basename: HGBA1C in the last 72 hours No results found for this basename: CHOL,HDL,LDLCALC,TRIG,CHOLHDL in the last 72 hours No results found for this basename: TSH,T4TOTAL,FREET3,T3FREE,THYROIDAB in the last 72 hours No results found for this basename: VITAMINB12,FOLATE,FERRITIN,TIBC,IRON,RETICCTPCT in the last 72 hours  Medications: Scheduled    . amiodarone  400 mg Oral BID  . aspirin EC  325 mg Oral Daily  . docusate sodium  200 mg Oral Daily  . enoxaparin  30 mg Subcutaneous Q24H  . furosemide  20 mg Intravenous Once  . furosemide  40 mg Oral Daily  . insulin aspart  0-24 Units Subcutaneous TID AC & HS  . metoprolol tartrate  12.5 mg Oral BID  . moving right along book   Does not apply Once  . pantoprazole  40 mg Oral QAC breakfast  . potassium chloride  20 mEq Oral Daily  . potassium chloride  40 mEq Oral Daily  . povidone-iodine  1 application Topical BID  . simvastatin  20 mg Oral QHS  . sodium chloride  3 mL Intravenous Q12H  . DISCONTD: docusate sodium  200 mg Oral Daily  . DISCONTD: furosemide  40 mg Oral Daily  . DISCONTD: metoprolol tartrate  12.5 mg Oral BID  . DISCONTD: pantoprazole  40 mg Oral QAC breakfast  . DISCONTD: potassium chloride  20 mEq Oral Daily  .  DISCONTD: povidone-iodine  1 application Topical BID  . DISCONTD: sodium chloride  10 mL Intracatheter Q12H  . DISCONTD: sodium chloride  3 mL Intravenous Q12H     Radiology/Studies:  Dg Chest Portable 1 View  07/17/2011  *RADIOLOGY REPORT*  Clinical Data: Post heart surgery  PORTABLE CHEST - 1 VIEW  Comparison: 07/16/2011; 07/15/2011; 07/14/2011  Findings: Unchanged enlarged cardiac silhouette and mediastinal contours post median sternotomy and CABG.  Stable position of support apparatus.  Grossly unchanged layering bilateral effusions and bibasilar consolidative / heterogeneous opacities, left greater than right.  There is  persistent cephalization of flow.  Grossly unchanged bones.  IMPRESSION: Grossly unchanged findings of pulmonary edema, bilateral effusions and bibasilar consolidative / heterogeneous opacities, left greater than right, possibly atelectasis though underlying infection is not excluded.  Original Report Authenticated By: Waynard Reeds, M.D.    INR: Will add last result for INR, ABG once components are confirmed Will add last 4 CBG results once components are confirmed  Assessment/Plan: S/P Procedure(s) (LRB): CORONARY ARTERY BYPASS GRAFTING (CABG)TIMES FIVE (N/A)   1. Cardiac rhythm: Continues to have some episodes of SVT. We'll continue amiodarone. We'll increase metoprolol to 25 mg by mouth twice a day. Continues aspirin. 2. Continue to mobilize. 3. Continue gentle diuresis. Her weight is 3 pounds less than yesterday. 4 continue pulmonary toilet. 5. Anemia is stable.   LOS: 11 days    GOLD,WAYNE E 11/11/20128:38 AM  patient examined and medical record reviewed,agree with above note.CXR in am to f/u small effusions VAN TRIGT III,PETER 07/18/2011

## 2011-07-19 ENCOUNTER — Inpatient Hospital Stay (HOSPITAL_COMMUNITY): Payer: Medicare Other

## 2011-07-19 ENCOUNTER — Encounter (HOSPITAL_COMMUNITY): Payer: Self-pay | Admitting: Dietician

## 2011-07-19 LAB — CROSSMATCH
Antibody Screen: NEGATIVE
Unit division: 0

## 2011-07-19 LAB — GLUCOSE, CAPILLARY: Glucose-Capillary: 150 mg/dL — ABNORMAL HIGH (ref 70–99)

## 2011-07-19 MED ORDER — SODIUM CHLORIDE 0.9 % IJ SOLN
10.0000 mL | INTRAMUSCULAR | Status: DC | PRN
  Administered 2011-07-19 – 2011-07-23 (×7): 10 mL

## 2011-07-19 MED ORDER — POLYSACCHARIDE IRON 150 MG PO CAPS
150.0000 mg | ORAL_CAPSULE | Freq: Every day | ORAL | Status: DC
  Administered 2011-07-19 – 2011-07-20 (×2): 150 mg via ORAL
  Filled 2011-07-19 (×2): qty 1

## 2011-07-19 NOTE — Progress Notes (Signed)
CARDIAC REHAB PHASE I   PRE:  Rate/Rhythm: 82SR  BP:  Supine:   Sitting: 98/62  Standing:    SaO2: 90%1L  MODE:  Ambulation: 150 ft   POST:  Rate/Rhythem: 89SR  BP:  Supine:   Sitting: 107/60  Standing:    SaO2: 87-92%@2L   Pt walked 150 ft with rolling Grabe on O2 at 2L and asst x 1.  Pt weak and c/o legs feeling like they would not carry her. Exhausted by end of walk.  Remained in NSR. To recliner with call bell. Encouraged IS. Left on O2 at 1L.  Needed 2L to walk. [id number]  Duanne Limerick

## 2011-07-19 NOTE — Progress Notes (Signed)
Physical Therapy Treatment Patient Details Name: [name] MRN: [id number] DOB: 1933/04/06 Today's Date: 07/19/2011  PT Assessment/Plan  PT - Assessment/Plan Comments on Treatment Session: Patient seemed very fatigued and lethargic during treatment session.  She was not able to walk outside of the room this afternoon.  RN reports that they are coming to take her for a thoracentesis soon and patient reports she has not been sleeping at night.   PT Plan: Discharge plan remains appropriate Follow Up Recommendations: Skilled nursing facility Equipment Recommended: Defer to next venue PT Goals  Acute Rehab PT Goals PT Goal: Supine/Side to Sit - Progress: Progressing toward goal PT Transfer Goal: Sit to Stand/Stand to Sit - Progress: Progressing toward goal PT Transfer Goal: Bed to Chair/Chair to Bed - Progress: Other (comment) (not tested today) PT Goal: Ambulate - Progress: Progressing toward goal  PT Treatment Precautions/Restrictions  Precautions Precautions: Sternal Precaution Comments: Educated pt. on sternal precautions with ADLs  Required Braces or Orthoses: No Restrictions Weight Bearing Restrictions: No Mobility (including Balance) Bed Mobility Bed Mobility: Yes Supine to Sit: 4: Min assist;HOB elevated (Comment degrees) (HOB 20 degrees) Supine to Sit Details (indicate cue type and reason): verbal cues to not pull with arms to get up to follow sternal precautions.  Min assist to support trunk to get to sitting.   Sitting - Scoot to Edge of Bed: 7: Independent Transfers Transfers: Yes Sit to Stand: 4: Min assist;From bed;From chair/3-in-1 Sit to Stand Details (indicate cue type and reason): min guard assist for safety verbal cues to use legs and not push up with arms.   Stand to Sit: 5: Supervision Stand to Sit Details: supervision for safety, verbal cues to lower with legs not with arms. Ambulation/Gait Ambulation/Gait: Yes Ambulation/Gait Assistance: 4: Min  assist Ambulation/Gait Assistance Details (indicate cue type and reason): min assist to stabilize patient over weak legs.  Patient feeling weak and lightheaded with gait, limiting gait distance.   Ambulation Distance (Feet): 25 Feet Assistive device: Rolling Fehr Gait Pattern: Shuffle    Exercise  General Exercises - Lower Extremity Long Arc Quad: AROM;Both;10 reps;Seated Hip ABduction/ADduction: AROM;Both;10 reps;Seated;Other (comment) (adduction against pillow for resistance.  ) Hip Flexion/Marching: AROM;Both;10 reps;Seated Toe Raises: AROM;Both;10 reps;Seated Heel Raises: AROM;Both;10 reps;Seated End of Session PT - End of Session Equipment Utilized During Treatment: Gait belt Activity Tolerance: Patient limited by fatigue Patient left: in chair;with call bell in reach Nurse Communication: Other (comment) (behavior during session-to RN tech who informed RN) General Behavior During Session: Hubbard Hartshorn B. Ladena Jacquez, PT, DPT [phone]  07/19/2011, 1:52 PM

## 2011-07-19 NOTE — Progress Notes (Signed)
PT Cancellation Note  ___Treatment cancelled today due to medical issues with patient which prohibited therapy  ___ Treatment cancelled today due to patient receiving procedure or test   ___ Treatment cancelled today due to patient's refusal to participate   _x__ Treatment cancelled today due to: patient just worked with cardiac rehab phase I.  PT to check back this PM to walk with patient again.     Signature: Rollene Rotunda. Britlee Skolnik, PT, DPT [phone]

## 2011-07-19 NOTE — Progress Notes (Signed)
TELEMETRY: Reviewed telemetry pt in NSR: Filed Vitals:   07/18/11 1428 07/18/11 2015 07/19/11 0434 07/19/11 0634  BP: 110/69 101/77 104/59   Pulse: 89 88 74   Temp: 98 F (36.7 C) 98.5 F (36.9 C) 97.3 F (36.3 C)   TempSrc:  Oral Oral   Resp: 18 19 20    Height:      Weight:    58.514 kg (129 lb)  SpO2: 93% 90% 92%     Intake/Output Summary (Last 24 hours) at 07/19/11 0814 Last data filed at 07/18/11 1700  Gross per 24 hour  Intake   1120 ml  Output   1352 ml  Net   -232 ml    SUBJECTIVE Feels weak. No dyspnea, palpitations, or chest pain. Appetite is poor but bowels working OK. Ambulating.  LABS: Basic Metabolic Panel:  Basename 07/18/11 0500 07/17/11 0459  NA 133* 134*  K 4.0 4.9  CL 95* 97  CO2 30 30  GLUCOSE 119* 136*  BUN 12 10  CREATININE 0.43* 0.40*  CALCIUM 8.9 8.6  MG -- --  PHOS -- --   Liver Function Tests: No results found for this basename: AST:2,ALT:2,ALKPHOS:2,BILITOT:2,PROT:2,ALBUMIN:2 in the last 72 hours No results found for this basename: LIPASE:2,AMYLASE:2 in the last 72 hours CBC:  Basename 07/18/11 0500 07/17/11 0459  WBC 8.0 9.0  NEUTROABS -- --  HGB 9.1* 9.0*  HCT 28.1* 27.5*  MCV 89.5 89.0  PLT 238 209   Cardiac Enzymes: No results found for this basename: CKTOTAL:3,CKMB:3,CKMBINDEX:3,TROPONINI:3 in the last 72 hours BNP: No results found for this basename: POCBNP:3 in the last 72 hours D-Dimer: No results found for this basename: DDIMER:2 in the last 72 hours Hemoglobin A1C: No results found for this basename: HGBA1C in the last 72 hours Fasting Lipid Panel: No results found for this basename: CHOL,HDL,LDLCALC,TRIG,CHOLHDL,LDLDIRECT in the last 72 hours Thyroid Function Tests: No results found for this basename: TSH,T4TOTAL,FREET3,T3FREE,THYROIDAB in the last 72 hours Anemia Panel: No results found for this basename: VITAMINB12,FOLATE,FERRITIN,TIBC,IRON,RETICCTPCT in the last 72 hours  Radiology/Studies:  Dg Chest 2  View  07/19/2011  *RADIOLOGY REPORT*  Clinical Data: Weakness and shortness of breath.  CABG.  CHEST - 2 VIEW  Comparison: 07/18/2011  Findings: There is a right chest wall porta-catheter with tip in the SVC.  Stable cardiac enlargement.  No change in bilateral pleural effusions left greater than right.  No significant interstitial edema or airspace consolidation.  IMPRESSION:  1.  Stable cardiac enlargement and bilateral pleural effusions.  Original Report Authenticated By: Rosealee Albee, M.D.   Dg Chest 2 View  07/18/2011  *RADIOLOGY REPORT*  Clinical Data: Postop CABG, weakness  CHEST - 2 VIEW  Comparison: 07/17/2011  Findings: Moderate left and small right pleural effusions. Associated lower lobe atelectasis.  No pneumothorax.  Stable right chest port.  Stable cardiomegaly. Postsurgical changes related to prior CABG.  IMPRESSION: Moderate left and small right pleural effusions.  No pneumothorax.  Original Report Authenticated By: Charline Bills, M.D.       PHYSICAL EXAM General: Elderly, thin, in no acute distress. Head: Normocephalic, atraumatic, sclera non-icteric, no xanthomas, nares are without discharge. Neck: Negative for carotid bruits. JVD not elevated. Lungs: Clear bilaterally to auscultation without wheezes, rales, or rhonchi. Breathing is unlabored. Heart: RRR S1 S2 without murmurs, rubs, or gallops.  Chest: median sternotomy scar. Abdomen: Soft, non-tender, non-distended with normoactive bowel sounds. No hepatomegaly. No rebound/guarding. No obvious abdominal masses. Msk:  Strength and tone appears normal for age. Extremities:  No clubbing, cyanosis or edema.  Distal pedal pulses are 2+ and equal bilaterally. Neuro: Alert and oriented X 3. Moves all extremities spontaneously. Psych:  Responds to questions appropriately with a normal affect.  ASSESSMENT AND PLAN:  Active Problems:  Recurrent adenocarcinoma of colon  Coronary artery disease  Anemia  Atrial  fibrillation  Plan: Atrial fibrillation well controlled on amiodarone. Would continue 400 mg bid for one week more then 400 mg daily. Continue metoprolol. Gentle diuresis. Keep potassium > 4.0.  Signed, Heavenleigh Petruzzi [place] MD

## 2011-07-19 NOTE — Progress Notes (Signed)
7 Days Post-Op Procedure(s) (LRB): CORONARY ARTERY BYPASS GRAFTING (CABG)TIMES FIVE (N/A)  Subjective: Patient is feeling a little weak.  She states she has some shortness of breath when ambulating.  She is currently on 1L O2 via [place].    Objective: Vital signs in last 24 hours: Patient Vitals for the past 24 hrs:  BP Temp Temp src Pulse Resp SpO2 Weight  07/19/11 0953 125/60 mmHg - - 81  - - -  07/19/11 0634 - - - - - - 129 lb (58.514 kg)  07/19/11 0434 104/59 mmHg 97.3 F (36.3 C) Oral 74  20  92 % -  07/18/11 2015 101/77 mmHg 98.5 F (36.9 C) Oral 88  19  90 % -  07/18/11 1428 110/69 mmHg 98 F (36.7 C) - 89  18  93 % -   Pre op weight  57 kg: today's weight 58.5 kg      Intake/Output from previous day: 11/11 0701 - 11/12 0700 In: 1120 [P.O.:1120] Out: 1552 [Urine:1550; Stool:2]   Physical Exam:  Cardiovascular: RRR, no murmurs, gallops, or rubs. Pulmonary: Decreased at bases bilaterally L>R; no rales, wheezes, or rhonchi. Abdomen: Soft, non tender, bowel sounds present. Extremities: Trace bilateral lower extremity edema. Wounds: Clean and dry.  No erythema or signs of infection.  Ecchymosis of right thigh.  Lab Results: CBC: Basename 07/18/11 0500 07/17/11 0459  WBC 8.0 9.0  HGB 9.1* 9.0*  HCT 28.1* 27.5*  PLT 238 209   BMET:  Basename 07/18/11 0500 07/17/11 0459  NA 133* 134*  K 4.0 4.9  CL 95* 97  CO2 30 30  GLUCOSE 119* 136*  BUN 12 10  CREATININE 0.43* 0.40*  CALCIUM 8.9 8.6    PT/INR: No results found for this basename: LABPROT,INR in the last 72 hours ABG:  INR: Will add last result for INR, ABG once components are confirmed Will add last 4 CBG results once components are confirmed  Assessment/Plan:  1. CV - Previous AF but maintaining SR on Amiodarone and Lopressor. 2.  Pulmonary - CXR shows persistent bilateral pleural effusions L>R.  Scheduled for left thoracentesis today. Wean O2 as tolerates. Encourage incentive spirometer. 3. Volume  Overload - Will continue with diuresis for now. 4.  Acute blood loss anemia - H/H stable at 9.1/28.1.  Start Nu Iron. 5.  HG A1C is 5.6.  Had one episode of hypoglycemia 11/11 but has been 110 or less with no fruther episodes.  Will stop glucose checks after today. 6.To SNF when surgically stable.   Ardelle Balls, PA 07/19/2011

## 2011-07-19 NOTE — Procedures (Signed)
Lt thoracentesis using US guidance 550cc bloody fluid obtained Pt tolerated well Therapeutic only  cxr pending

## 2011-07-19 NOTE — Consult Note (Signed)
Tobacco cessation- Received consult. Pt quit 20 years ago.

## 2011-07-19 NOTE — Progress Notes (Signed)
Inpatient Diabetes Program Recommendations  AACE/ADA: New Consensus Statement on Inpatient Glycemic Control (2009)  Target Ranges:  Prepandial:   less than 140 mg/dL      Peak postprandial:   less than 180 mg/dL (1-2 hours)      Critically ill patients:  140 - 180 mg/dL   Reason for Visit: Hypoglycemia  11/11:  55 mg/dL  Inpatient Diabetes Program Recommendations Correction (SSI): Discontinue Novolog

## 2011-07-20 LAB — GLUCOSE, CAPILLARY
Glucose-Capillary: 114 mg/dL — ABNORMAL HIGH (ref 70–99)
Glucose-Capillary: 97 mg/dL (ref 70–99)
Glucose-Capillary: 140 mg/dL — ABNORMAL HIGH (ref 70–99)

## 2011-07-20 MED ORDER — PREDNISONE (PAK) 5 MG PO TABS
5.0000 mg | ORAL_TABLET | ORAL | Status: AC
  Administered 2011-07-20: 5 mg via ORAL

## 2011-07-20 MED ORDER — PREDNISONE (PAK) 5 MG PO TABS
5.0000 mg | ORAL_TABLET | Freq: Three times a day (TID) | ORAL | Status: AC
  Administered 2011-07-21 (×3): 5 mg via ORAL

## 2011-07-20 MED ORDER — PREDNISONE (PAK) 5 MG PO TABS
5.0000 mg | ORAL_TABLET | Freq: Four times a day (QID) | ORAL | Status: DC
  Administered 2011-07-22 – 2011-07-23 (×6): 5 mg via ORAL
  Filled 2011-07-20: qty 21

## 2011-07-20 MED ORDER — PREDNISONE (PAK) 5 MG PO TABS
10.0000 mg | ORAL_TABLET | Freq: Every evening | ORAL | Status: AC
  Administered 2011-07-20: 10 mg via ORAL

## 2011-07-20 MED ORDER — PREDNISONE (PAK) 5 MG PO TABS
10.0000 mg | ORAL_TABLET | Freq: Every evening | ORAL | Status: AC
  Administered 2011-07-21: 10 mg via ORAL

## 2011-07-20 MED ORDER — AMIODARONE HCL 200 MG PO TABS
200.0000 mg | ORAL_TABLET | Freq: Two times a day (BID) | ORAL | Status: AC
  Administered 2011-07-20 – 2011-07-22 (×5): 200 mg via ORAL
  Filled 2011-07-20 (×5): qty 1

## 2011-07-20 MED ORDER — DOCUSATE SODIUM 100 MG PO CAPS
100.0000 mg | ORAL_CAPSULE | Freq: Every day | ORAL | Status: DC
  Administered 2011-07-21 – 2011-07-23 (×3): 100 mg via ORAL
  Filled 2011-07-20 (×4): qty 1

## 2011-07-20 MED ORDER — PREDNISONE (PAK) 5 MG PO TABS
10.0000 mg | ORAL_TABLET | Freq: Every morning | ORAL | Status: AC
  Administered 2011-07-20: 10 mg via ORAL
  Filled 2011-07-20: qty 21

## 2011-07-20 NOTE — Progress Notes (Signed)
8 Days Post-Op Procedure(s) (LRB): CORONARY ARTERY BYPASS GRAFTING (CABG)TIMES FIVE (N/A)  Subjective: Patient with complaints of not sleeping well, but does not want something to help with this.  Objective: Vital signs in last 24 hours: Patient Vitals for the past 24 hrs:  BP Temp Temp src Pulse Resp SpO2 Weight  07/20/11 0519 103/53 mmHg 97.7 F (36.5 C) Oral 77  18  94 % 125 lb 6.4 oz (56.881 kg)  07/19/11 2100 103/56 mmHg 98.7 F (37.1 C) Oral 88  18  90 % -  07/19/11 1518 85/51 mmHg - - - - - -  07/19/11 1503 93/56 mmHg - - - - - -  07/19/11 1326 110/61 mmHg 98.2 F (36.8 C) Oral 78  19  93 % -  07/19/11 0953 125/60 mmHg - - 81  - - -   Pre op weight  57 kg: today's weight 56.8 kg      Intake/Output from previous day: 11/12 0701 - 11/13 0700 In: 240 [P.O.:240] Out: 2475 [Urine:2475]   Physical Exam:  Cardiovascular: RRR, no murmurs, gallops, or rubs. Pulmonary: Decreased at bases bilaterally ; no rales, wheezes, or rhonchi. Abdomen: Soft, non tender, bowel sounds present. Extremities: Trace bilateral lower extremity edema. Wounds: Clean and dry.  No erythema or signs of infection.  Ecchymosis of right and left thigh.  Lab Results: CBC:  Basename 07/18/11 0500  WBC 8.0  HGB 9.1*  HCT 28.1*  PLT 238   BMET:   Basename 07/18/11 0500  NA 133*  K 4.0  CL 95*  CO2 30  GLUCOSE 119*  BUN 12  CREATININE 0.43*  CALCIUM 8.9    PT/INR: No results found for this basename: LABPROT,INR in the last 72 hours ABG:  INR: Will add last result for INR, ABG once components are confirmed Will add last 4 CBG results once components are confirmed  Assessment/Plan:  1. CV - Previous AF but maintaining SR on Amiodarone and Lopressor.  Cont to monitor SBP as has been in low 100's.  Lopressor has parameters. 2.  Pulmonary - s/p left thoracentesis with about 550 cc removed from left.   Follow up CXR showed decrease in left pl effusion and stable right pleural effusion, no  pneumothorax. Patient still on 2L O2 via [place]. May need  O2 at discharge . Encourage incentive spirometer. 3. Volume Overload - Below pre op weight , but still with pleural effusions. Will continue with diuresis for now. 4.  Acute blood loss anemia - H/H stable at 9.1/28.1.   Nu Iron started 11/12.. 5.  HG A1C is 5.6.  Had one episode of hypoglycemia 11/11 but has been 110 or less with no fruther episodes.  Will stop glucose checks after today. 6.To SNF when surgically stable.   Ardelle Balls, PA 07/20/2011

## 2011-07-20 NOTE — Progress Notes (Signed)
TELEMETRY: Reviewed telemetry pt in NSR, no AFIB: Filed Vitals:   07/19/11 1503 07/19/11 1518 07/19/11 2100 07/20/11 0519  BP: 93/56 85/51 103/56 103/53  Pulse:   88 77  Temp:   98.7 F (37.1 C) 97.7 F (36.5 C)  TempSrc:   Oral Oral  Resp:   18 18  Height:      Weight:    56.881 kg (125 lb 6.4 oz)  SpO2:   90% 94%    Intake/Output Summary (Last 24 hours) at 07/20/11 0840 Last data filed at 07/20/11 0528  Gross per 24 hour  Intake    240 ml  Output   2475 ml  Net  -2235 ml    SUBJECTIVE Feels well. No SOB or chest pain. Ambulating   LABS: Basic Metabolic Panel:  Basename 07/18/11 0500  NA 133*  K 4.0  CL 95*  CO2 30  GLUCOSE 119*  BUN 12  CREATININE 0.43*  CALCIUM 8.9  MG --  PHOS --   Liver Function Tests: No results found for this basename: AST:2,ALT:2,ALKPHOS:2,BILITOT:2,PROT:2,ALBUMIN:2 in the last 72 hours No results found for this basename: LIPASE:2,AMYLASE:2 in the last 72 hours CBC:  Basename 07/18/11 0500  WBC 8.0  NEUTROABS --  HGB 9.1*  HCT 28.1*  MCV 89.5  PLT 238   Cardiac Enzymes: No results found for this basename: CKTOTAL:3,CKMB:3,CKMBINDEX:3,TROPONINI:3 in the last 72 hours BNP: No results found for this basename: POCBNP:3 in the last 72 hours D-Dimer: No results found for this basename: DDIMER:2 in the last 72 hours Hemoglobin A1C: No results found for this basename: HGBA1C in the last 72 hours Fasting Lipid Panel: No results found for this basename: CHOL,HDL,LDLCALC,TRIG,CHOLHDL,LDLDIRECT in the last 72 hours Thyroid Function Tests: No results found for this basename: TSH,T4TOTAL,FREET3,T3FREE,THYROIDAB in the last 72 hours Anemia Panel: No results found for this basename: VITAMINB12,FOLATE,FERRITIN,TIBC,IRON,RETICCTPCT in the last 72 hours  Radiology/Studies:  Dg Chest 1 View  07/19/2011  *RADIOLOGY REPORT*  Clinical Data: Post thoracentesis.  CHEST - 1 VIEW  Comparison: 07/19/2011  Findings:  Grossly unchanged cardiac  silhouette and mediastinal contours post median sternotomy and CABG.  Unchanged positioning of right subclavian vein approach Port-A-Cath with tip overlying the superior aspect the right atrium.  Interval decrease in now small left-sided pleural effusion post thoracentesis. Grossly unchanged biapical pleural parenchymal thickening, left greater than right. No pneumothorax.  Unchanged small right-sided pleural effusion and bibasilar heterogeneous opacities.  Grossly unchanged bones.  IMPRESSION: 1.  Interval decrease in now small left-sided effusion post left- sided thoracentesis.  No pneumothorax.  2.  Unchanged small right-sided effusion and bibasilar opacities, possibly atelectasis  Original Report Authenticated By: Waynard Reeds, M.D.     PHYSICAL EXAM General: Elderly, frail, in no acute distress. Head: Normocephalic, atraumatic, sclera non-icteric. Neck: Negative for carotid bruits. JVD not elevated. Lungs: Clear bilaterally to auscultation without wheezes, rales, or rhonchi. Breathing is unlabored. Heart: RRR S1 S2 without murmurs, rubs, or gallops.  Abdomen: Soft, non-tender, non-distended with normoactive bowel sounds. No hepatomegaly. No rebound/guarding. No obvious abdominal masses. Extremities: No clubbing, cyanosis or edema.  Distal pedal pulses are 2+ and equal bilaterally. Neuro: Alert and oriented X 3. Moves all extremities spontaneously.  ASSESSMENT AND PLAN:  1. Atrial fibrillation: no recurrence on amiodarone. Continue current amiodarone and lopressor Rx.   2. S/p CABG. Progressing slowly but streadily for age. Anticipate DC to SNF when ok with surgery.  Principal Problem:  *Anemia Active Problems:  Recurrent adenocarcinoma of colon  Coronary  artery disease  Atrial fibrillation    Signed, Shabria Egley [place] MD

## 2011-07-20 NOTE — Progress Notes (Signed)
cCARDIAC REHAB PHASE I   PRE:  Rate/Rhythm: 85SR  BP:  Supine:   Sitting: 102/57  Standing:    SaO2: 92%RA  MODE:  Ambulation: 150 ft   POST:  Rate/Rhythem: 83  BP:  Supine:   Sitting: 104/66  Standing:    SaO2: 91%RA  Pt walked 150 ft with rolling H[place] and asst x2. Stopped frequently to rest due to fatigue and SOB. Exhausted by end of walk. Walked to new room. TO recliner and call bell. Set up lunch. [phone]  Duanne Limerick

## 2011-07-20 NOTE — Progress Notes (Signed)
EPW discontined per MD order and per hospital protocol.  All ends intact pt tolerated procedure well.  Will continue to monitor closely.  Pt reminded to stay in bed for one hour.  Continous vital signs started per Md order and per protocol.  Pts VS after wires pulled were 104/56  Pulse 85 NSR

## 2011-07-20 NOTE — Progress Notes (Addendum)
Pt has bed available at [facility]. CSW will continue to follow to facilitate d/c planning when pt medically ready.   CSW spoke with pt son by phone, who states he will likely be able to transport pt to SNF when she is ready. CSW spoke with [facility] to advise of pt status.

## 2011-07-21 LAB — CBC
MCH: 28.9 pg (ref 26.0–34.0)
MCHC: 32.3 g/dL (ref 30.0–36.0)
Platelets: 397 10*3/uL (ref 150–400)
RDW: 15.4 % (ref 11.5–15.5)

## 2011-07-21 LAB — BASIC METABOLIC PANEL
BUN: 17 mg/dL (ref 6–23)
Calcium: 9.3 mg/dL (ref 8.4–10.5)
Creatinine, Ser: 0.46 mg/dL — ABNORMAL LOW (ref 0.50–1.10)
GFR calc non Af Amer: >90 mL/min (ref >90)
Glucose, Bld: 136 mg/dL — ABNORMAL HIGH (ref 70–99)

## 2011-07-21 LAB — GLUCOSE, CAPILLARY: Glucose-Capillary: 132 mg/dL — ABNORMAL HIGH (ref 70–99)

## 2011-07-21 NOTE — Progress Notes (Addendum)
9 Days Post-Op Procedure(s) (LRB): CORONARY ARTERY BYPASS GRAFTING (CABG)TIMES FIVE (N/A) Subjective: Just finished bathing and feels weak.  Overall feeling better.  Objective: Vital signs in last 24 hours: Temp:  [97.2 F (36.2 C)-97.6 F (36.4 C)] 97.6 F (36.4 C) (11/14 0612) Pulse Rate:  [71-85] 71  (11/14 0612) Cardiac Rhythm:  [-] Normal sinus rhythm;Other (Comment) (11/14 1610) Resp:  [18] 18  (11/14 0612) BP: (106-120)/(56-71) 111/66 mmHg (11/14 0612) SpO2:  [92 %-93 %] 92 % (11/14 0612) Weight:  [122 lb 8 oz (55.566 kg)] 122 lb 8 oz (55.566 kg) (11/14 0612)  Hemodynamic parameters for last 24 hours:    Intake/Output from previous day: 11/13 0701 - 11/14 0700 In: 940 [P.O.:840; I.V.:100] Out: -  Intake/Output this shift: Total I/O In: 240 [P.O.:240] Out: -   General appearance: no distress Heart: regular rate and rhythm Lungs: clear to auscultation bilaterally Extremities: no edema, redness or tenderness in the calves or thighs Wound: Clean, dry and without erythema  Lab Results:  [facility] 07/21/11 0610  WBC 9.0  HGB 10.4*  HCT 32.2*  PLT 397   BMET:  Basename 07/21/11 0610  NA 136  K 4.1  CL 99  CO2 26  GLUCOSE 136*  BUN 17  CREATININE 0.46*  CALCIUM 9.3    PT/INR: No results found for this basename: LABPROT,INR in the last 72 hours ABG    Component Value Date/Time   PHART 7.386 07/12/2011 2003   HCO3 21.7 07/12/2011 2003   TCO2 23 07/13/2011 1657   ACIDBASEDEF 3.0* 07/12/2011 2003   O2SAT 96.0 07/12/2011 2003   CBG (last 3)   Basename 07/21/11 0614 07/20/11 2056 07/20/11 1650  GLUCAP 150* 140* 120*    Assessment/Plan: S/P Procedure(s) (LRB): CORONARY ARTERY BYPASS GRAFTING (CABG)TIMES FIVE (N/A) CV- no further arrhythmias.  Continue current meds S/P left thoracentesis - breathing stable, sats good Deconditioning- continue ambulation Disp- SNF on Friday. ( Pt's family won't be available for transfer until then)   LOS: 14 days     COLLINS,GINA H 07/21/2011    Says today has been her best day.  To [facility] on Friday

## 2011-07-21 NOTE — Progress Notes (Signed)
CARDIAC REHAB PHASE I   PRE:  Rate/Rhythm: 79 SR    BP: sitting 106/66    SaO2: 94 RA  MODE:  Ambulation: 330 ft   POST:  Rate/Rhythm: 88    BP: sitting 11/66     SaO2: 91-92 RA  Somewhat stronger today, assist x2 with RW.  Still c/o weakness and at times dizziness.  Return to recliner with VSS. [phone]  Harriet Masson CES, ACSM

## 2011-07-21 NOTE — Plan of Care (Signed)
Problem: Phase III Progression Outcomes Goal: Discharge plan remains appropriate-arrangements made Outcome: Progressing Plan to d/c 07/22/11 to SNF for rehab

## 2011-07-21 NOTE — Progress Notes (Signed)
Physical Therapy Treatment Patient Details Name: [name] MRN: [id number] DOB: 07/06/1933 Today's Date: 07/21/2011  PT Assessment/Plan  PT - Assessment/Plan PT Plan: Discharge plan remains appropriate Follow Up Recommendations: Skilled nursing facility Equipment Recommended: Defer to next venue PT Goals  Acute Rehab PT Goals PT Goal: Supine/Side to Sit - Progress: Progressing toward goal PT Transfer Goal: Sit to Stand/Stand to Sit - Progress: Progressing toward goal PT Transfer Goal: Bed to Chair/Chair to Bed - Progress: Progressing toward goal PT Goal: Ambulate - Progress: Progressing toward goal  PT Treatment Precautions/Restrictions  Precautions Precautions: Fall;Sternal Precaution Comments: Educated pt. on sternal precautions with ADLs  Required Braces or Orthoses: No Restrictions Weight Bearing Restrictions: No Mobility (including Balance) Bed Mobility Bed Mobility: Yes Supine to Sit:  (min guard A) Sitting - Scoot to Edge of Bed: 5: Supervision Transfers Transfers: Yes Sit to Stand: From bed (min guard A) Sit to Stand Details (indicate cue type and reason): vc's for sternal precautions Ambulation/Gait Ambulation/Gait: Yes Ambulation/Gait Assistance: Other (comment) (min guard A) Ambulation/Gait Assistance Details (indicate cue type and reason): worked on scanning, changes in cadence and some distance without RW to assess her balance and give pt an idea off how she had progressed Ambulation Distance (Feet): 280 Feet Assistive device: Rolling [place];None Gait Pattern: Within Functional Limits (pt was noteably more unsteady without RW, but only mildly so)  Posture/Postural Control Posture/Postural Control: No significant limitations Balance Balance Assessed: Yes (see gait comments) Exercise  Other Exercises Other Exercises: standing hip flexion Bil x 10 active and hip ab/ad Bil x 10 Active End of Session PT - End of Session Activity Tolerance: Patient  limited by fatigue Patient left: Other (comment) (sitting EOB) General Behavior During Session: [facility] for tasks performed Cognition: [facility] for tasks performed  Tye Vigo, Eliseo Gum 07/21/2011, 5:19 PM  07/21/2011  [place] Bing, PT [phone] (pager)

## 2011-07-21 NOTE — Progress Notes (Signed)
TELEMETRY: Reviewed telemetry pt in NSR, no atrial fib: Filed Vitals:   07/20/11 1049 07/20/11 1400 07/20/11 2102 07/21/11 0612  BP: 120/56 110/60 106/71 111/66  Pulse: 85 75 81 71  Temp:  97.2 F (36.2 C) 97.5 F (36.4 C) 97.6 F (36.4 C)  TempSrc:  Oral Oral Oral  Resp:  18 18 18   Height:      Weight:    55.566 kg (122 lb 8 oz)  SpO2:  93% 92% 92%    Intake/Output Summary (Last 24 hours) at 07/21/11 0829 Last data filed at 07/21/11 0400  Gross per 24 hour  Intake    700 ml  Output      0 ml  Net    700 ml    SUBJECTIVE Feels well today. Still a little weak but progressing well with activity. No dyspnea, palpitations, or chest pain.  LABS: Basic Metabolic Panel:  Basename 07/21/11 0610  NA 136  K 4.1  CL 99  CO2 26  GLUCOSE 136*  BUN 17  CREATININE 0.46*  CALCIUM 9.3  MG --  PHOS --   Liver Function Tests: No results found for this basename: AST:2,ALT:2,ALKPHOS:2,BILITOT:2,PROT:2,ALBUMIN:2 in the last 72 hours No results found for this basename: LIPASE:2,AMYLASE:2 in the last 72 hours CBC:  Basename 07/21/11 0610  WBC 9.0  NEUTROABS --  HGB 10.4*  HCT 32.2*  MCV 89.4  PLT 397   Cardiac Enzymes: No results found for this basename: CKTOTAL:3,CKMB:3,CKMBINDEX:3,TROPONINI:3 in the last 72 hours BNP: No results found for this basename: POCBNP:3 in the last 72 hours D-Dimer: No results found for this basename: DDIMER:2 in the last 72 hours Hemoglobin A1C: No results found for this basename: HGBA1C in the last 72 hours Fasting Lipid Panel: No results found for this basename: CHOL,HDL,LDLCALC,TRIG,CHOLHDL,LDLDIRECT in the last 72 hours Thyroid Function Tests: No results found for this basename: TSH,T4TOTAL,FREET3,T3FREE,THYROIDAB in the last 72 hours Anemia Panel: No results found for this basename: VITAMINB12,FOLATE,FERRITIN,TIBC,IRON,RETICCTPCT in the last 72 hours  Radiology/Studies:  Dg Chest 1 View  07/19/2011  *RADIOLOGY REPORT*  Clinical  Data: Post thoracentesis.  CHEST - 1 VIEW  Comparison: 07/19/2011  Findings:  Grossly unchanged cardiac silhouette and mediastinal contours post median sternotomy and CABG.  Unchanged positioning of right subclavian vein approach Port-A-Cath with tip overlying the superior aspect the right atrium.  Interval decrease in now small left-sided pleural effusion post thoracentesis. Grossly unchanged biapical pleural parenchymal thickening, left greater than right. No pneumothorax.  Unchanged small right-sided pleural effusion and bibasilar heterogeneous opacities.  Grossly unchanged bones.  IMPRESSION: 1.  Interval decrease in now small left-sided effusion post left- sided thoracentesis.  No pneumothorax.  2.  Unchanged small right-sided effusion and bibasilar opacities, possibly atelectasis  Original Report Authenticated By: Waynard Reeds, M.D.    PHYSICAL EXAM General: Elderly, frail, in no acute distress. Head: Normocephalic, atraumatic, sclera non-icteric. Neck: Negative for carotid bruits. JVD not elevated. Lungs: Clear bilaterally to auscultation without wheezes, rales, or rhonchi.Slightly diminished breath sounds on left. Breathing is unlabored. Heart: RRR S1 S2 without murmurs, rubs, or gallops.  Abdomen: Soft, non-tender, non-distended with normoactive bowel sounds. No hepatomegaly. No rebound/guarding. No obvious abdominal masses. Msk:  Strength and tone appears normal for age. Extremities: No clubbing, cyanosis or edema.  Distal pedal pulses are 2+ and equal bilaterally. Neuro: Alert and oriented X 3. Moves all extremities spontaneously. Psych:  Responds to questions appropriately with a normal affect.  ASSESSMENT AND PLAN:  1. Atrial fibrillation. Now in  NSR on amiodarone. Continue 200 mg bid. On lopressor.  2. CAD s/p CABG  3. Volume overload with left pleural effusion. S/p thoracentesis. On lasix. Doing well.  4. Disposition. Plan DC to SNF next 1-2 days. Will need follow up with Dr.  Daleen Squibb as outpatient in 2-3 weeks.   Principal Problem:  *Anemia Active Problems:  Recurrent adenocarcinoma of colon  Coronary artery disease  Atrial fibrillation    Signed, Amariya Liskey [place] MD

## 2011-07-22 LAB — GLUCOSE, CAPILLARY: Glucose-Capillary: 79 mg/dL (ref 70–99)

## 2011-07-22 MED ORDER — METOPROLOL TARTRATE 12.5 MG HALF TABLET: 12.5000 mg | ORAL_TABLET | Freq: Two times a day (BID) | ORAL | Status: DC

## 2011-07-22 MED ORDER — SIMVASTATIN 20 MG PO TABS: 20.0000 mg | ORAL_TABLET | Freq: Every day | ORAL | Status: DC

## 2011-07-22 MED ORDER — PREDNISONE (PAK) 5 MG PO TABS: 5.0000 mg | ORAL_TABLET | Freq: Four times a day (QID) | ORAL | Status: DC

## 2011-07-22 MED ORDER — ASPIRIN 325 MG PO TBEC: 325.0000 mg | DELAYED_RELEASE_TABLET | Freq: Every day | ORAL | Status: AC

## 2011-07-22 MED ORDER — TRAMADOL HCL 50 MG PO TABS: 50.0000 mg | ORAL_TABLET | ORAL | Status: DC | PRN

## 2011-07-22 MED ORDER — GUAIFENESIN-DM 100-10 MG/5ML PO SYRP: 15.0000 mL | ORAL_SOLUTION | Freq: Four times a day (QID) | ORAL | Status: AC | PRN

## 2011-07-22 MED ORDER — FUROSEMIDE 40 MG PO TABS: 40.0000 mg | ORAL_TABLET | Freq: Every day | ORAL | Status: DC

## 2011-07-22 MED ORDER — AMIODARONE HCL 200 MG PO TABS: 200.0000 mg | ORAL_TABLET | Freq: Two times a day (BID) | ORAL | Status: DC

## 2011-07-22 MED ORDER — POTASSIUM CHLORIDE 10 MEQ PO TBCR: 20.0000 meq | EXTENDED_RELEASE_TABLET | Freq: Every day | ORAL | Status: DC

## 2011-07-22 NOTE — Progress Notes (Signed)
CARDIAC REHAB PHASE I   PRE:  Rate/Rhythm: 79SR  BP:  Supine:   Sitting: 100/54  Standing:    SaO2: 94%RA  MODE:  Ambulation: 500 ft   POST:  Rate/Rhythem: 81  BP:  Supine:   Sitting: 100/70  Standing:    SaO2: 92%RA  Pt walked 500 ft with rolling Ammons and asst x 1 with steady gait. Tolerated well. Stronger. Discussed Phase 2 and permission given to refer to [facility] program. To chair with call bell. [phone]  Duanne Limerick

## 2011-07-22 NOTE — Discharge Summary (Addendum)
Physician Discharge Summary  Patient ID: [name] MRN: [id number] DOB/AGE: 04-13-1933 77 y.o.  Admit date: 07/07/2011 Discharge date: 07/23/2011  Admission Diagnoses:   1.NSTEMI 2.Multivessel CAD (EF 40-45%) 3.History of tobacco abuse 4.Newly diagnosed hyperlipidemia 5. History of metastatic colon cancer (status post subtotal colectomy in 2001; chemotherapy) 6. History of ovarian cancer (status post TAH/BSO severe 2003) 7. History of asthma 8. History of rhematoid arthritis 9. History of polymyalgia rheumatica  Discharge Diagnoses:   1.NSTEMI 2.Multivessel CAD (EF 40-45%) 3.History of tobacco abuse 4.Newly diagnosed hyperlipidemia 5. History of metastatic colon cancer (status post subtotal colectomy in 2001; chemotherapy) 6. History of ovarian cancer (status post TAH/BSO severe 2003) 7. History of asthma 8. History of rhematoid arthritis 9. History of polymyalgia rheumatica    Procedures:   1.Cardiaccatheterization done on 07/07/2011 by Dr.Arida. 2.CORONARY ARTERY BYPASS GRAFTING (CABG)TIMES FIVE on 07/12/2011 3.DCCV by Dr. Swaziland 07/14/2011 4.Left thoracentesis 07/19/2011.    Consultants:   Victorino December, MD   Discharged Condition: Improved  History of Presenting Illness:        A 77 year old Caucasian female with no previous cardiac history who initially presented [facility] on 07/06/2011 with complaints of bilateral arm pain. 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 She developed a sudden onset of bilateral arm pain and said her arms felt" heavy and achy". She apparently did not have any nausea emesis or diaphoresis. She initially went to her primary care physician's office (Dr.Luking) who then referred her to the emergency room. Her first set of cardiac enzymes were negative but her second set of enzymes showed a  troponin of 1.02. The patient also had dynamic ETG changes as evidenced by an initial EKG showing T-wave inversion, ST depression in aVL lead 1, and subtle ST elevation in lead 3 and aVR. All up EKG that apparently demonstrated normal sinus rhythm without any acute changes. She was given aspirin, heparin, 600 mg of Plavix and transferred to [facility] cone for further evaluation and treatment.      A cardiac catheterization was done on 07/07/2011 by Dr. Kirke Corin. The patient was found to have severe three-vessel coronary artery disease with an ejection fraction of 40-45%. A cardiothoracic consultation was obtained with Dr. [name] for consideration of coronary bypass grafting surgery.It should be noted that at the time she denied any chest pain or arm pain.  Preoperative carotid duplex carotid ultrasound showed no significant internal carotid artery stenoses bilaterally. Potential risks, complications, and benefits were discussed with the patient and she agreed to proceed with surgery. It should be noted, however, that because the patient was loaded with Plavix, there needed to be at least a 5-7 day Plavix washout prior to undergoing surgery.  In addition, because the patient did have a history of cancer, an oncology consult was obtained and the patient  was stable to undergo heart surgery. Patient then underwent a median sternotomy for CABG x5 in (LIMA to LAD, SVG to diagonal 1, SVG sequentially to obtuse marginal one and 2, SVG to distal RCA) with the [facility] from the right thigh and a combination of EVH and open vein harvest from the left thigh on 07/12/2011.        Hospital Course:  Patient was extubated without any difficulty. She remained afebrile and hemodynamically stable. Swan-Ganz, A-line, chest tubes, and Foley were all removed early  in her postoperative course. She was weaned off Neo synephrine and was then started on a low-dose beta blocker. She was found to be volume overloaded and diuresed  accordingly. The patient then went into atrial fibrillation with RVR and also had hypotension.  Dr. Swaziland saw and evaluated the patient and felt she needed DCCV. The patient was cardioverted 4 times on this successful. She was then given 2 boluses of amiodarone 50 mg IV with subsequent conversion to sinus rhythm and improvement of her SBP to 110. She  was continued on her low-dose beta blocker and when she converted to sinus rhythm she was placed on amiodarone by mouth. Patient was found to have acute blood loss anemia.Her  H/H went down to 7.7/24 and 07/16/2011. She was given packed red blood cells. Followup H&H is a 9 and 27.5 respectivelyShe was felt surgically stable for transfer from the intensive care unit to be PCTU for further convalescence.      She was still requiring a  couple of liters of oxygen via nasal cannula postoperatively and was gradually weaned to room air.  She was found to have bilateral pleural effusions on chest x-ray (left greater than right). She underwent a left lower thoracentesis by ultrasound guidance on 07/19/2011. Approximately 550 cc of bloody fluid was obtained.. Patient  continued to progress with cardiac rehabilitation. She has already been tolerating a diet has had multiple bowel movements. Provided she remains afebrile,  in sinus rhythm, vital signs remain stable, and pending morning round evaluation, she will be surgically stable to a skilled nursing facility on 07/23/2011.             Recent vital signs:  Filed Vitals:   07/22/11 1300  BP: 103/65  Pulse: 77  Temp: 97.5 F (36.4 C)  Resp: 18    Discharge Exam:  Blood pressure 103/65, pulse 77, temperature 97.5 F (36.4 C), temperature source Oral, resp. rate 18, height 5\' 5"  (1.651 m), weight 122 lb 6.4 oz (55.52 kg), SpO2 91.00%.  General appearance: no distress  Heart: regular rate and rhythm  Lungs: clear to auscultation bilaterally  Extremities: extremities normal, atraumatic, no cyanosis or  edema  Wounds: Clean and dry without erythema   Recent laboratory studies:  Lab Results  Component Value Date   WBC 9.0 07/21/2011   HGB 10.4* 07/21/2011   HCT 32.2* 07/21/2011   MCV 89.4 07/21/2011   PLT 397 07/21/2011   Lab Results  Component Value Date   NA 136 07/21/2011   K 4.1 07/21/2011   CL 99 07/21/2011   CO2 26 07/21/2011   CREATININE 0.46* 07/21/2011   GLUCOSE 136* 07/21/2011     Diagnostic Studies: Dg Chest 1 View  07/19/2011  *RADIOLOGY REPORT*  Clinical Data: Post thoracentesis.  CHEST - 1 VIEW  Comparison: 07/19/2011  Findings:  Grossly unchanged cardiac silhouette and mediastinal contours post median sternotomy and CABG.  Unchanged positioning of right subclavian vein approach Port-A-Cath with tip overlying the superior aspect the right atrium.  Interval decrease in now small left-sided pleural effusion post thoracentesis. Grossly unchanged biapical pleural parenchymal thickening, left greater than right. No pneumothorax.  Unchanged small right-sided pleural effusion and bibasilar heterogeneous opacities.  Grossly unchanged bones.  IMPRESSION: 1.  Interval decrease in now small left-sided effusion post left- sided thoracentesis.  No pneumothorax.  2.  Unchanged small right-sided effusion and bibasilar opacities, possibly atelectasis  Original Report Authenticated By: Waynard Reeds, M.D.       Discharge Medications:  Current Discharge Medication List    START taking these medications   Details  amiodarone (PACERONE) 200 MG tablet Take 1 tablet (200 mg total) by mouth 2 (two) times daily.    aspirin EC 325 MG EC tablet Take 1 tablet (325 mg total) by mouth daily. Qty: 30 tablet    furosemide (LASIX) 40 MG tablet Take 1 tablet (40 mg total) by mouth daily.    guaiFENesin-dextromethorphan (ROBITUSSIN DM) 100-10 MG/5ML syrup Take 15 mLs by mouth every 6 (six) hours as needed for cough. Qty: 118 mL    metoprolol tartrate (LOPRESSOR) 12.5 mg TABS Take 0.5  tablets (12.5 mg total) by mouth 2 (two) times daily.    potassium chloride (KLOR-CON) 10 MEQ CR tablet Take 2 tablets (20 mEq total) by mouth daily.    predniSONE (STERAPRED UNI-PAK) 5 MG TABS Take 1 tablet (5 mg total) by mouth taper from 4 doses each day to 1 dose and stop.    simvastatin (ZOCOR) 20 MG tablet Take 1 tablet (20 mg total) by mouth at bedtime.    traMADol (ULTRAM) 50 MG tablet Take 1-2 tablets (50-100 mg total) by mouth every 4 (four) hours as needed. Maximum dose= 8 tablets per day Qty: 40 tablet, Refills: 0       Condition:Stable.  Disposition: Short Term Hospital  Discharge Instructions:  Discharge Orders    Future Appointments: Provider: Department: Dept Phone: Center:   08/11/2011 10:30 AM Loreli Slot, MD [facility] [phone] [facility]   08/20/2011 9:20 AM [facility] [phone] None   08/24/2011 11:00 AM Randall An, MD [facility] [phone] None   10/18/2011 11:30 AM [facility] [phone] [place]   11/22/2011 10:30 AM [facility] [phone] None   02/22/2012 10:00 AM [facility] [phone] None   05/24/2012 10:30 AM [facility] [phone] None      Follow Up Appointments: Follow-up Information    Follow up with Loreli Slot, MD on 08/11/2011. (PA/LAT CXR 9:15 am;Appointment is at 10:00 am.)    Contact information:   [address] [place] [address] [phone]       Make an appointment with Valera Castle, MD. (Call for an appointment for 2 weeks)    Contact information:   [address] [place] [address] [phone]           Signed: Ardelle Balls 07/22/2011, 5:00 PM

## 2011-07-22 NOTE — Progress Notes (Signed)
CSW spoke with [facility] to advise of anticipated pt d/c tomorrow. CSW will continue to follow to facilitate transfer to SNF when pt is medically ready.

## 2011-07-22 NOTE — Progress Notes (Addendum)
10 Days Post-Op Procedure(s) (LRB): CORONARY ARTERY BYPASS GRAFTING (CABG)TIMES FIVE (N/A) Subjective: "Having a down day today."  Rested poorly last night.  No specific complaints.  Objective: Vital signs in last 24 hours: Temp:  [97 F (36.1 C)-97.3 F (36.3 C)] 97.3 F (36.3 C) (11/15 0627) Pulse Rate:  [69-77] 69  (11/15 0627) Cardiac Rhythm:  [-] Normal sinus rhythm (11/15 0932) Resp:  [18] 18  (11/15 0627) BP: (108-118)/(71-74) 118/74 mmHg (11/15 0627) SpO2:  [91 %-92 %] 92 % (11/15 0627) Weight:  [122 lb 6.4 oz (55.52 kg)] 122 lb 6.4 oz (55.52 kg) (11/15 0627)  Hemodynamic parameters for last 24 hours:    Intake/Output from previous day: 11/14 0701 - 11/15 0700 In: 863 [P.O.:860; I.V.:3] Out: 1225 [Urine:1225] Intake/Output this shift: Total I/O In: 240 [P.O.:240] Out: 200 [Urine:200]  General appearance: no distress Heart: regular rate and rhythm Lungs: clear to auscultation bilaterally Extremities: extremities normal, atraumatic, no cyanosis or edema Wound: Clean and dry without erythema  Lab Results:  [facility] 07/21/11 0610  WBC 9.0  HGB 10.4*  HCT 32.2*  PLT 397   BMET:  Basename 07/21/11 0610  NA 136  K 4.1  CL 99  CO2 26  GLUCOSE 136*  BUN 17  CREATININE 0.46*  CALCIUM 9.3    PT/INR: No results found for this basename: LABPROT,INR in the last 72 hours ABG    Component Value Date/Time   PHART 7.386 07/12/2011 2003   HCO3 21.7 07/12/2011 2003   TCO2 23 07/13/2011 1657   ACIDBASEDEF 3.0* 07/12/2011 2003   O2SAT 96.0 07/12/2011 2003   CBG (last 3)   Basename 07/22/11 1134 07/22/11 0628 07/21/11 2041  GLUCAP 79 135* 122*    Assessment/Plan: S/P Procedure(s) (LRB): CORONARY ARTERY BYPASS GRAFTING (CABG)TIMES FIVE (N/A) Continue current care. To SNF in am.   LOS: 15 days    COLLINS,GINA H 07/22/2011   Agree with above, to SNF tomorrow AM

## 2011-07-23 LAB — GLUCOSE, CAPILLARY: Glucose-Capillary: 110 mg/dL — ABNORMAL HIGH (ref 70–99)

## 2011-07-23 MED ORDER — TRAMADOL HCL 50 MG PO TABS: 50.0000 mg | ORAL_TABLET | ORAL | Status: AC | PRN

## 2011-07-23 MED ORDER — POTASSIUM CHLORIDE 10 MEQ PO TBCR: 20.0000 meq | EXTENDED_RELEASE_TABLET | Freq: Every day | ORAL | Status: DC

## 2011-07-23 MED ORDER — FUROSEMIDE 40 MG PO TABS: 40.0000 mg | ORAL_TABLET | Freq: Every day | ORAL | Status: DC

## 2011-07-23 MED ORDER — HEPARIN SOD (PORK) LOCK FLUSH 100 UNIT/ML IV SOLN
500.0000 [IU] | INTRAVENOUS | Status: AC | PRN
  Administered 2011-07-23: 500 [IU]

## 2011-07-23 NOTE — Progress Notes (Signed)
CSW has contacted [facility] to confirm pt d/c today. Facility is ready to receive pt, and has d/c summary. Packet assembled and given to pt. Pt son on his way and will transport pt after lunch. Pt very grateful for CSW assistance and support. CSW signing off. Amedeo Gory Willow Desha Bitner

## 2011-07-23 NOTE — Progress Notes (Signed)
11 Days Post-Op Procedure(s) (LRB): CORONARY ARTERY BYPASS GRAFTING (CABG)TIMES FIVE (N/A)  Subjective: Patient not sleeping well but unchanged since admission as has always had difficulty sleeping.  Objective: Vital signs in last 24 hours: Patient Vitals for the past 24 hrs:  BP Temp Temp src Pulse Resp SpO2 Weight  07/23/11 0405 112/74 mmHg 97.5 F (36.4 C) Oral 73  18  95 % 126 lb 8 oz (57.38 kg)  07/22/11 2020 110/71 mmHg 97.9 F (36.6 C) Oral 81  18  93 % -  07/22/11 1300 103/65 mmHg 97.5 F (36.4 C) Oral 77  18  91 % -   Pre op weight  57 kg;Today's weight 57.3 kg     Intake/Output from previous day: 11/15 0701 - 11/16 0700 In: 1080 [P.O.:1080] Out: 651 [Urine:650; Stool:1]   Physical Exam:  Cardiovascular: RRR, no murmurs, gallops, or rubs. Pulmonary: Clear to auscultation bilaterally; no rales, wheezes, or rhonchi. Abdomen: Soft, non tender, bowel sounds present. Extremities: Trace bilateral lower extremity edema. Wounds: Clean and dry.  No erythema or signs of infection.  Lab Results: CBC: Basename 07/21/11 0610  WBC 9.0  HGB 10.4*  HCT 32.2*  PLT 397   BMET:  Basename 07/21/11 0610  NA 136  K 4.1  CL 99  CO2 26  GLUCOSE 136*  BUN 17  CREATININE 0.46*  CALCIUM 9.3    PT/INR: No results found for this basename: LABPROT,INR in the last 72 hours ABG:  INR: Will add last result for INR, ABG once components are confirmed Will add last 4 CBG results once components are confirmed  Assessment/Plan:  Overall, surgically stable for discharge to SNF.  Ardelle Balls, PA 07/23/2011

## 2011-07-23 NOTE — Progress Notes (Signed)
[id number] Cardiac Rehab Completed discharge education with pt. She agrees to [employer]. CRP in , will send referral

## 2011-07-23 NOTE — Progress Notes (Signed)
PT TO DC TO [facility] TODAY, PER CSW ARRANGEMENTS.     Jerrell Belfast, RN, BSN Pager # [phone]

## 2011-07-23 NOTE — Progress Notes (Signed)
Physical Therapy Treatment Patient Details Name: [name] MRN: [id number] DOB: 08/29/1933 Today's Date: 07/23/2011  PT Assessment/Plan  PT - Assessment/Plan PT Plan: Discharge plan remains appropriate Follow Up Recommendations: Skilled nursing facility Equipment Recommended: Defer to next venue PT Goals  Acute Rehab PT Goals PT Goal Formulation: With patient PT Goal: Supine/Side to Sit - Progress: Met PT Transfer Goal: Sit to Stand/Stand to Sit - Progress: Met PT Transfer Goal: Bed to Chair/Chair to Bed - Progress: Met PT Goal: Ambulate - Progress: Met  PT Treatment Precautions/Restrictions  Precautions Precautions: Fall;Other (comment) (MILD) Precaution Comments: Educated pt. on sternal precautions with ADLs  Required Braces or Orthoses: No Restrictions Weight Bearing Restrictions: No Mobility (including Balance) Bed Mobility Bed Mobility: Yes Supine to Sit: 7: Independent Sitting - Scoot to Edge of Bed: 7: Independent Transfers Transfers: Yes Sit to Stand: 5: Supervision Sit to Stand Details (indicate cue type and reason): NO CUES NEEDED Stand to Sit: 5: Supervision Ambulation/Gait Ambulation/Gait: Yes Ambulation/Gait Assistance: 5: Supervision Ambulation/Gait Assistance Details (indicate cue type and reason): no significant deviation with scanning, velocity changes, turns; no cues needed Ambulation Distance (Feet): 350 Feet Assistive device: None Gait Pattern: Within Functional Limits (mildly unsteady with scanning with a 270 degree arc) Stairs: Yes Stairs Assistance: 5: Supervision Stair Management Technique: One rail Right;One rail Left;Alternating pattern;Forwards Number of Stairs: 6     Exercise    End of Session PT - End of Session Activity Tolerance: Patient tolerated treatment well Patient left: in chair  Lagena Strand, Eliseo Gum 07/23/2011, 1:15 PM  07/23/2011  Deuel Bing, PT [phone] (pager)

## 2011-08-04 ENCOUNTER — Ambulatory Visit (INDEPENDENT_AMBULATORY_CARE_PROVIDER_SITE_OTHER): Payer: Medicare Other | Admitting: Physician Assistant

## 2011-08-04 ENCOUNTER — Encounter: Payer: Self-pay | Admitting: Physician Assistant

## 2011-08-04 VITALS — BP 82/52 | HR 89 | Ht 65.0 in | Wt 121.4 lb

## 2011-08-04 DIAGNOSIS — I2589 Other forms of chronic ischemic heart disease: Secondary | ICD-10-CM

## 2011-08-04 DIAGNOSIS — I959 Hypotension, unspecified: Secondary | ICD-10-CM

## 2011-08-04 DIAGNOSIS — I4891 Unspecified atrial fibrillation: Secondary | ICD-10-CM

## 2011-08-04 DIAGNOSIS — I2510 Atherosclerotic heart disease of native coronary artery without angina pectoris: Secondary | ICD-10-CM

## 2011-08-04 DIAGNOSIS — E785 Hyperlipidemia, unspecified: Secondary | ICD-10-CM

## 2011-08-04 DIAGNOSIS — I255 Ischemic cardiomyopathy: Secondary | ICD-10-CM

## 2011-08-04 NOTE — Assessment & Plan Note (Signed)
Maintaining NSR.  Decrease amiodarone to 200 mg QD.  Suspect this can be d/c at follow up if she remains in NSR.

## 2011-08-04 NOTE — Assessment & Plan Note (Signed)
Check lipids and LFTs in 4 weeks. 

## 2011-08-04 NOTE — Assessment & Plan Note (Signed)
Volume is stable and I suspect she is hypotensive from over-diuresis.  Stop Lasix as noted and weigh daily.

## 2011-08-04 NOTE — Assessment & Plan Note (Addendum)
Doing well post CABG.  She is slowly progressing.  She is hypotensive today.  She appears dry to me.  Stop Lasix and K+.  I have sent orders to [facility] to weigh her daily and to give extra lasix if her weight goes up.  Check a BMET today.  Continue ASA and simvastatin.  Continue with PT at [facility].  Once she is released, she will need to transition to cardiac rehab.  Recommend she drink Ensure TID with meals for extra calories.  Follow up with Dr. Valera Castle in 4 weeks.  Of note, she prefers to be seen in [place].  Her son has seen Dr. Brook Bing in the past and she will see him if she cannot get on Dr. Vern Claude schedule.

## 2011-08-04 NOTE — Patient Instructions (Addendum)
Your physician recommends that you schedule a follow-up appointment in: 4 WEEKS IN [place] WITH EITHER DR. WALL OR DR. Dietrich Pates.  Your physician recommends that you return for lab work in: TODAY BMET   Your physician has recommended you make the following change in your medication: STOP TAKING LASIX AND POTASSIUM;  DECREASE AMIODARONE TO 200 MG 1 TABLET DAILY.  WEIGH DAILY AND IF YOUR WEIGHT IS INCREASED IN 1 DAY BY 2-3 LB'S YOU ARE TO TAKE LASIX 20 MG AND POTASSIUM 10 MEQ.

## 2011-08-04 NOTE — Progress Notes (Signed)
[address] [place], [place]  [address] Phone: [phone] Fax:  [phone]   History of Present Illness: PCP:  Dr. Lilyan Punt Primary Cardiologist:  Dr. Valera Castle   [name] is a 77 y.o. female who presents for post hospital follow up.  She has a h/o HTN, HLP, metastatic colon CA, s/p subtotal colectomy and chemotherapy and ovarian CA.  She is a prior smoker with a strong FHx of CAD.  Her son, who is here with her today, had a heart transplant in his 98s for ischemic heart disease.  She was admitted 10/31-11/16.  She presented to [facility] with arm pain, nausea and dyspnea. She was tx from [facility] and ruled in for NSTEMI.  LHC 10/31 demonstrated 3v CAD and EF 45%.  She was referred for CABG: L-LAD, S-D1, S-OM1/OM2, S-dRCA with Dr. [name].  Post op, she developed AF with RVR with associated hypotension.  She required DCCV and amiodarone to restore NSR.  She was tx with PRBCs due to anemia.  She had thoracentesis for LLL effusion on 11/12.  Labs: TC 176, TG 194, HDL 51, LDL 86, A1C 5.6, TSH 1.126, ALT 45, Hgb 10.4, K 4.1, creatinine 0.46.  She was discharged to [facility].  She is not happy with her care there.  She is eager to get home.  She feels fatigued.  The patient still has some chest and leg soreness.  She denies shortness of breath, syncope, orthopnea, PND or significant pedal edema.  She is working with PT and is increasing her activity steadily.  She does not have much of an appetite.  She states she was recently started on thyroid medication, but I have no record of that today.    Past Medical History  Diagnosis Date  . Ovarian cancer     s/p TAH/BSO  . Colon cancer metastasized to multiple sites 03/15/2011    s/p partial colectomy and chemotherapy  . History of polymyalgia rheumatica   . Rheumatoid arthritis   . Fibromyalgia   . Anemia 07/18/2011    post op  . CAD (coronary artery disease)     NSTEMI 07/07/11 - LHC with 3 v CAD ==>  CABG:  L-LAD, S-D1, S-OM1/OM2, S-dRCA with Dr. [name] 11/12  . Ischemic cardiomyopathy     EF 45% at [facility] 10/12  . Atrial fibrillation     post op; tx with amiodarone and DCCV  . HLD (hyperlipidemia)     Current Outpatient Prescriptions  Medication Sig Dispense Refill  . amiodarone (PACERONE) 200 MG tablet Take 1 tablet (200 mg total) by mouth 2 (two) times daily.      Marland Kitchen aspirin 325 MG tablet Take 325 mg by mouth daily.        . furosemide (LASIX) 40 MG tablet Take 1 tablet (40 mg total) by mouth daily.      Marland Kitchen guaiFENesin-dextromethorphan (ROBITUSSIN DM) 100-10 MG/5ML syrup Take 5 mLs by mouth 3 (three) times daily as needed.        . metoprolol tartrate (LOPRESSOR) 12.5 mg TABS Take 0.5 tablets (12.5 mg total) by mouth 2 (two) times daily.      . potassium chloride (KLOR-CON) 10 MEQ CR tablet Take 2 tablets (20 mEq total) by mouth daily.      . predniSONE (STERAPRED UNI-PAK) 5 MG TABS Take 1 tablet (5 mg total) by mouth taper from 4 doses each day to 1 dose and stop.      . simvastatin (ZOCOR)  20 MG tablet Take 1 tablet (20 mg total) by mouth at bedtime.      . traMADol (ULTRAM) 50 MG tablet Take 50 mg by mouth every 4 (four) hours as needed. Maximum dose= 8 tablets per day         Allergies: Allergies  Allergen Reactions  . Sulfa Drugs Cross Reactors     History  Substance Use Topics  . Smoking status: Former Smoker -- 2.0 packs/day for 28 years    Types: Cigarettes  . Smokeless tobacco: Not on file  . Alcohol Use: No     ROS:  Please see the history of present illness.   All other systems reviewed and negative.   Vital Signs: BP 82/52  Pulse 89  Ht 5\' 5"  (1.651 m)  Wt 121 lb 6.4 oz (55.067 kg)  BMI 20.20 kg/m2  PHYSICAL EXAM: Well nourished, well developed, in no acute distress HEENT: normal Neck: no JVD at 90 degrees Cardiac:  normal S1, S2; RRR; no murmur Lungs:  clear to auscultation bilaterally, no wheezing, rhonchi or rales Abd: soft, nontender Ext: no  edema Skin: warm and dry Neuro:  CNs 2-12 intact, no focal abnormalities noted  EKG:   NSR, HR 89, LAD, RBBB, NSSTTW changes, no change from prior  ASSESSMENT AND PLAN:

## 2011-08-05 LAB — BASIC METABOLIC PANEL
BUN: 18 mg/dL (ref 6–23)
CO2: 28 mEq/L (ref 19–32)
Calcium: 9 mg/dL (ref 8.4–10.5)
Glucose, Bld: 114 mg/dL — ABNORMAL HIGH (ref 70–99)
Sodium: 143 mEq/L (ref 135–145)

## 2011-08-05 NOTE — Progress Notes (Signed)
Jonny Ruiz, nurse at [facility] was notified and labs and orders were faxed to him at [phone].

## 2011-08-06 ENCOUNTER — Other Ambulatory Visit: Payer: Self-pay | Admitting: Thoracic Surgery (Cardiothoracic Vascular Surgery)

## 2011-08-06 DIAGNOSIS — I2510 Atherosclerotic heart disease of native coronary artery without angina pectoris: Secondary | ICD-10-CM

## 2011-08-11 ENCOUNTER — Ambulatory Visit (INDEPENDENT_AMBULATORY_CARE_PROVIDER_SITE_OTHER): Payer: Self-pay | Admitting: Thoracic Surgery (Cardiothoracic Vascular Surgery)

## 2011-08-11 ENCOUNTER — Encounter: Payer: Self-pay | Admitting: Thoracic Surgery (Cardiothoracic Vascular Surgery)

## 2011-08-11 ENCOUNTER — Ambulatory Visit
Admission: RE | Admit: 2011-08-11 | Discharge: 2011-08-11 | Disposition: A | Discharge status: Home / Self Care | Payer: Medicare Other | Source: Ambulatory Visit | Attending: Thoracic Surgery (Cardiothoracic Vascular Surgery) | Admitting: Thoracic Surgery (Cardiothoracic Vascular Surgery)

## 2011-08-11 VITALS — BP 94/70 | HR 76 | Resp 18 | Ht 65.5 in | Wt 120.0 lb

## 2011-08-11 DIAGNOSIS — I2510 Atherosclerotic heart disease of native coronary artery without angina pectoris: Secondary | ICD-10-CM

## 2011-08-11 DIAGNOSIS — Z951 Presence of aortocoronary bypass graft: Secondary | ICD-10-CM

## 2011-08-11 MED ORDER — PREDNISONE (PAK) 10 MG PO TABS: ORAL_TABLET | ORAL | Status: DC

## 2011-08-11 NOTE — Progress Notes (Signed)
HPI: [name] returns today for a scheduled postoperative followup visit. She had coronary bypass grafting complicated by postoperative atrial fibrillation. She did convert to sinus rhythm with amiodarone. She was discharged to [facility] for continued rehabilitation prior to going home. She's now been there about 2-1/2 weeks, and is discharged from there and returning home today. She states that she's feeling much better, she denies chest pain or shortness of breath. She does have some mild incisional discomfort but is not using Ultram anymore for that. She is anxious to resume full activities.   Current Outpatient Prescriptions  Medication Sig Dispense Refill  . amiodarone (PACERONE) 200 MG tablet Take 1 tablet (200 mg total) by mouth daily.      Marland Kitchen aspirin 325 MG tablet Take 325 mg by mouth daily.        Marland Kitchen guaiFENesin-dextromethorphan (ROBITUSSIN DM) 100-10 MG/5ML syrup Take 5 mLs by mouth 3 (three) times daily as needed.        . metoprolol tartrate (LOPRESSOR) 12.5 mg TABS Take 0.5 tablets (12.5 mg total) by mouth 2 (two) times daily.      . simvastatin (ZOCOR) 20 MG tablet Take 1 tablet (20 mg total) by mouth at bedtime.      . traMADol (ULTRAM) 50 MG tablet Take 50 mg by mouth every 4 (four) hours as needed. Maximum dose= 8 tablets per day       . levothyroxine (SYNTHROID, LEVOTHROID) 50 MCG tablet Take 50 mcg by mouth daily.        . predniSONE (STERAPRED UNI-PAK) 10 MG tablet 5 tablets by mouth day 1, 4 tablets day 2, 3 tablets day 3, 2 tablets day 4, 1 tablet day 5  15 tablet  0    Physical Exam BP 94/70  Pulse 76  Resp 18  Ht 5' 5.5" (1.664 m)  Wt 120 lb (54.432 kg)  BMI 19.67 kg/m2  SpO2 97% On exam his Creelman is a well-appearing 77 year old woman in no acute distress Neurologic alert and oriented x3, nonfocal Cardiac regular rate and rhythm, normal S1 and S2, no murmur or rub Lungs clear to auscultation percussion on the right, mild decreased breath sounds on the left  base Sternum is stable Sternal incision clean dry and intact No peripheral edema Right leg incisions minimal eschar no erythema or drainage   Diagnostic Tests: Chest x-ray shows a small left pleural effusion  Impression: [name] is now about a month out and coronary bypass grafting. Overall she's doing very well and is ready to be at home by herself, her son will assist her with meals and housework. She could begin cardiac rehabilitation in time.  She has not been taking Synthroid, apparently there was some confusion about whether she needed it might indication was.  She does have some residual left pleural effusion on chest x-ray. This is not symptomatic. I do not think there is enough fluid to warrant thoracentesis at this time. But we will give her steroid taper to see if we can get that to resolve.  She is concerned about having to take medications long-term. I informed her that she would need to stay on aspirin, Lopressor, and simvastatin, she may discontinued amiodarone in 2 weeks.  She may begin driving, appropriate precautions were discussed, she is to avoid heavy traffic and high speeds and long trips until the first of the year.  She's not to lift any objects greater than 10 pounds for list another 2 weeks, and nothing over 20 pounds for  4 weeks.  I will be happy to see her back at any time that I can be of any further assistance with her care.  Plan:

## 2011-08-12 NOTE — Discharge Summary (Signed)
Agree with d/c summary

## 2011-08-20 ENCOUNTER — Encounter (HOSPITAL_BASED_OUTPATIENT_CLINIC_OR_DEPARTMENT_OTHER): Payer: Medicare Other

## 2011-08-20 DIAGNOSIS — C189 Malignant neoplasm of colon, unspecified: Secondary | ICD-10-CM

## 2011-08-20 DIAGNOSIS — I998 Other disorder of circulatory system: Secondary | ICD-10-CM | POA: Insufficient documentation

## 2011-08-20 LAB — COMPREHENSIVE METABOLIC PANEL
ALT: 10 U/L (ref 0–35)
AST: 15 U/L (ref 0–37)
Calcium: 9.8 mg/dL (ref 8.4–10.5)
Creatinine, Ser: 0.62 mg/dL (ref 0.50–1.10)
Sodium: 141 mEq/L (ref 135–145)
Total Protein: 6.9 g/dL (ref 6.0–8.3)

## 2011-08-20 LAB — CBC
Hemoglobin: 13 g/dL (ref 12.0–15.0)
MCH: 28.3 pg (ref 26.0–34.0)
MCHC: 31.3 g/dL (ref 30.0–36.0)
Platelets: 217 10*3/uL (ref 150–400)
RDW: 16 % — ABNORMAL HIGH (ref 11.5–15.5)

## 2011-08-20 LAB — CEA: CEA: 1.2 ng/mL (ref 0.0–5.0)

## 2011-08-20 NOTE — Progress Notes (Signed)
Labs drawn today for cbc,cea,cmp 

## 2011-08-24 ENCOUNTER — Encounter (HOSPITAL_BASED_OUTPATIENT_CLINIC_OR_DEPARTMENT_OTHER): Payer: Medicare Other | Admitting: Oncology

## 2011-08-24 ENCOUNTER — Encounter (HOSPITAL_COMMUNITY)
Admission: RE | Admit: 2011-08-24 | Discharge: 2011-08-24 | Disposition: A | Discharge status: Home / Self Care | Payer: Medicare Other | Source: Ambulatory Visit | Attending: Oncology | Admitting: Oncology

## 2011-08-24 ENCOUNTER — Encounter (HOSPITAL_COMMUNITY): Payer: Self-pay

## 2011-08-24 VITALS — BP 118/75 | HR 79 | Temp 97.4°F | Wt 126.2 lb

## 2011-08-24 DIAGNOSIS — I998 Other disorder of circulatory system: Secondary | ICD-10-CM

## 2011-08-24 DIAGNOSIS — I878 Other specified disorders of veins: Secondary | ICD-10-CM

## 2011-08-24 DIAGNOSIS — C189 Malignant neoplasm of colon, unspecified: Secondary | ICD-10-CM

## 2011-08-24 MED ORDER — HEPARIN SOD (PORK) LOCK FLUSH 100 UNIT/ML IV SOLN
INTRAVENOUS | Status: AC
  Administered 2011-08-24: 500 [IU] via INTRAVENOUS
  Filled 2011-08-24: qty 5

## 2011-08-24 MED ORDER — SODIUM CHLORIDE 0.9 % IJ SOLN
10.0000 mL | INTRAMUSCULAR | Status: DC | PRN
  Administered 2011-08-24: 10 mL via INTRAVENOUS
  Filled 2011-08-24: qty 10

## 2011-08-24 MED ORDER — SODIUM CHLORIDE 0.9 % IJ SOLN
INTRAMUSCULAR | Status: AC
  Administered 2011-08-24: 10 mL via INTRAVENOUS
  Filled 2011-08-24: qty 10

## 2011-08-24 MED ORDER — HEPARIN SOD (PORK) LOCK FLUSH 100 UNIT/ML IV SOLN
500.0000 [IU] | Freq: Once | INTRAVENOUS | Status: AC
  Administered 2011-08-24: 500 [IU] via INTRAVENOUS
  Filled 2011-08-24: qty 5

## 2011-08-24 NOTE — Patient Instructions (Signed)
Pt has finished orientation and is scheduled to start CR on 09/13/11 at 8:15. Pt has been instructed to arrive to class 15 minutes early for scheduled class. Pt has been instructed to wear comfortable clothing and shoes with rubber soles. Pt has been told to take their medications 1 hour prior to coming to class.  If the patient is not going to attend class, he/she has been instructed to call.

## 2011-08-24 NOTE — Progress Notes (Addendum)
Patient is being referred to Cardiac Rehab from Dr. Cassell Clement. Dr. [place] Bing is her Cardiologist. Patients qualifying diagnosis for Cardiac Rehab is due to having open heart surgery Coronary Artery Bypass Graft of 5 vessels on 07/12/11. During orientation advised patient on arrival and appointment times what to wear, what to do before, during and after exercise. Reviewed attendance and class policy. Talked about inclement weather and class consultation policy. Pt is scheduled to start Cardiac Rehab on 09/13/11 at 8:15. Pt was advised to come to class 5 minutes before class starts. He was also given instructions on meeting with the dietician and attending the Family Structure classes. Pt is eager to get started.

## 2011-08-24 NOTE — Patient Instructions (Signed)
ANNISHA BAAR  [id number] 1933/05/29   [facility]  Discharge Instructions  RECOMMENDATIONS MADE BY THE CONSULTANT AND ANY TEST RESULTS WILL BE SENT TO YOUR REFERRING DOCTOR.   EXAM FINDINGS BY MD TODAY AND SIGNS AND SYMPTOMS TO REPORT TO CLINIC OR PRIMARY MD: you are doing well.  Report changes in bowel habits,blood in stool, or other problems.   MEDICATIONS PRESCRIBED: none      SPECIAL INSTRUCTIONS/FOLLOW-UP: Xray Studies Needed PET scan in February and Return to Clinic every 6 weeks for port flush and after PET scan to see Dr. Mariel Sleet.   I acknowledge that I have been informed and understand all the instructions given to me and received a copy. I do not have any more questions at this time, but understand that I may call the Specialty Clinic at [facility] at [phone] during business hours should I have any further questions or need assistance in obtaining follow-up care.    __________________________________________  _____________  __________ Signature of Patient or Authorized Representative            Date                   Time    __________________________________________ Nurse's Signature

## 2011-08-24 NOTE — Progress Notes (Signed)
[name] presented for Portacath access and flush. Proper placement of portacath confirmed by CXR. Portacath located right chest wall accessed with  H 20 needle. Good blood return present. Portacath flushed with 20ml NS and 500U/5ml Heparin and needle removed intact. Procedure without incident. Patient tolerated procedure well.   

## 2011-08-24 NOTE — Progress Notes (Signed)
Morgan Punt, MD, MD [address] [place] [address]  1. Recurrent adenocarcinoma of colon  CBC, Differential, Comprehensive metabolic panel, CEA  2. Poor venous access      CURRENT THERAPY: S/P 6 cycles of capecitabine finishing in September 2012.  INTERVAL HISTORY: [name] 77 y.o. female returns for  regular  visit for followup of  Recurrent adenocarcinoma of the colon with recently found disease in the pelvis 1.3 cm in size, PET-avid metastasis, close to the pubic symphysis. She had been enrolled on a clinical trial but had failed and was then placed on capecitabine, and has had now 6 cycles, finishing her last dose approximately September 2012. [name] had a PET scan in August which showed no evidence of disease.  The patient was recently in the hospital for cardiac bypass procedure.  She is recovering from that surgical intervention well.  She is presently in cardiac rehab.   She denies any complaints.  Her bowel movements have not changed.  Her caliber remains stable.  She denies any blood in stool and pain.  Interestingly, since her cardiac bypass, she has not had any headaches and sharp shooting pains described in Dr. Thornton Papas previous note.    She has decreased her caffeine intake.    Past Medical History  Diagnosis Date  . Ovarian cancer     s/p TAH/BSO  . Colon cancer metastasized to multiple sites 03/15/2011    s/p partial colectomy and chemotherapy  . History of polymyalgia rheumatica   . Rheumatoid arthritis   . Fibromyalgia   . Anemia 07/18/2011    post op  . CAD (coronary artery disease)     NSTEMI 07/07/11 - LHC with 3 v CAD ==>  CABG: L-LAD, S-D1, S-OM1/OM2, S-dRCA with Dr. [name] 11/12  . Ischemic cardiomyopathy     EF 45% at [facility] 10/12  . Atrial fibrillation     post op; tx with amiodarone and DCCV  . HLD (hyperlipidemia)     has Recurrent adenocarcinoma of colon; Shoulder pain, bilateral; Abnormal EKG; Elbow pain; Hypertension;  Coronary artery disease; Anemia; Atrial fibrillation; Ischemic cardiomyopathy; and HLD (hyperlipidemia) on her problem list.     is allergic to sulfa drugs cross reactors.  [name] had no medications administered during this visit.  Past Surgical History  Procedure Date  . Abdominal hysterectomy   . Appendectomy   . Tonsillectomy   . Anterior fascia resection via laparotomy   . Bladder surgery Dec 2011  . Coronary artery bypass graft 07/12/2011    Procedure: CORONARY ARTERY BYPASS GRAFTING (CABG)TIMES FIVE;  Surgeon: Loreli Slot, MD;  Location: [facility] OR;  Service: Open Heart Surgery;  Laterality: N/A;  utilizing left internal mammary artery and bilateral saphenous veins harvested endoscopically    Denies any headaches, dizziness, double vision, fevers, chills, night sweats, nausea, vomiting, diarrhea, constipation, chest pain, heart palpitations, shortness of breath, blood in stool, black tarry stool, urinary pain, urinary burning, urinary frequency, hematuria.   PHYSICAL EXAMINATION  ECOG PERFORMANCE STATUS: 0 - Asymptomatic  Filed Vitals:   08/24/11 1108  BP: 118/75  Pulse: 79  Temp: 97.4 F (36.3 C)    GENERAL:alert, no distress, well nourished, well developed, comfortable, cooperative and smiling SKIN: skin color, texture, turgor are normal, no rashes or significant lesions HEAD: Normocephalic EYES: normal EARS: External ears normal OROPHARYNX:mucous membranes are moist  NECK: supple, trachea midline LYMPH:  no palpable lymphadenopathy, no hepatosplenomegaly BREAST:right breast normal without mass, skin or nipple changes or axillary  nodes, left breast normal without mass, skin or nipple changes or axillary nodes LUNGS: clear to auscultation and percussion HEART: regular rate & rhythm, no murmurs, no gallops, S1 normal and S2 normal.  Midline surgical incision healing.  No signs of infection. ABDOMEN:abdomen soft, non-tender, normal bowel sounds and no  hepatosplenomegaly BACK: Back symmetric, no curvature., No CVA tenderness EXTREMITIES:less then 2 second capillary refill, no joint deformities, effusion, or inflammation, no edema, no skin discoloration, no clubbing, no cyanosis, healing wounds from vein removal for bypass.  NEURO: alert & oriented x 3 with fluent speech, no focal motor/sensory deficits, gait normal   LABORATORY DATA: CBC    Component Value Date/Time   WBC 6.0 08/20/2011 0917   RBC 4.59 08/20/2011 0917   HGB 13.0 08/20/2011 0917   HCT 41.6 08/20/2011 0917   PLT 217 08/20/2011 0917   MCV 90.6 08/20/2011 0917   MCH 28.3 08/20/2011 0917   MCHC 31.3 08/20/2011 0917   RDW 16.0* 08/20/2011 0917   LYMPHSABS 1.4 07/06/2011 1701   MONOABS 0.5 07/06/2011 1701   EOSABS 0.1 07/06/2011 1701   BASOSABS 0.0 07/06/2011 1701      Chemistry      Component Value Date/Time   NA 141 08/20/2011 0917   K 4.4 08/20/2011 0917   CL 103 08/20/2011 0917   CO2 30 08/20/2011 0917   BUN 8 08/20/2011 0917   CREATININE 0.62 08/20/2011 0917      Component Value Date/Time   CALCIUM 9.8 08/20/2011 0917   ALKPHOS 83 08/20/2011 0917   AST 15 08/20/2011 0917   ALT 10 08/20/2011 0917   BILITOT 1.0 08/20/2011 0917     Lab Results  Component Value Date   CEA 1.2 08/20/2011      ASSESSMENT:  1. Recurrent adenocarcinoma of the colon with recently found disease in the pelvis 1.3 cm in size, PET-avid metastasis, close to the pubic symphysis. She had been enrolled on a clinical trial but had failed and was then placed on capecitabine, and has had now 6 cycles, finishing her last dose approximately September 2012. [name] had a PET scan in August which showed no evidence of disease. 2. Heart disease 3. S/P Cardiac bypass    PLAN:  1. PET scan in Feb 2013. 2. Lab work every 3 months: CBC diff, CMET, CEA 3. I personally reviewed and went over laboratory results with the patient. 4. Return in Feb 2013 for follow-up following PET  scan.   All questions were answered. The patient knows to call the clinic with any problems, questions or concerns. We can certainly see the patient much sooner if necessary.   Edge Mauger

## 2011-09-02 ENCOUNTER — Encounter: Payer: Self-pay | Admitting: *Deleted

## 2011-09-02 ENCOUNTER — Ambulatory Visit (INDEPENDENT_AMBULATORY_CARE_PROVIDER_SITE_OTHER): Payer: Medicare Other | Admitting: Cardiology

## 2011-09-02 ENCOUNTER — Encounter: Payer: Self-pay | Admitting: Cardiology

## 2011-09-02 VITALS — BP 148/84 | HR 69 | Ht 65.5 in | Wt 124.0 lb

## 2011-09-02 DIAGNOSIS — M069 Rheumatoid arthritis, unspecified: Secondary | ICD-10-CM | POA: Insufficient documentation

## 2011-09-02 DIAGNOSIS — I2510 Atherosclerotic heart disease of native coronary artery without angina pectoris: Secondary | ICD-10-CM | POA: Insufficient documentation

## 2011-09-02 DIAGNOSIS — I4891 Unspecified atrial fibrillation: Secondary | ICD-10-CM | POA: Insufficient documentation

## 2011-09-02 DIAGNOSIS — E785 Hyperlipidemia, unspecified: Secondary | ICD-10-CM | POA: Insufficient documentation

## 2011-09-02 DIAGNOSIS — Z8739 Personal history of other diseases of the musculoskeletal system and connective tissue: Secondary | ICD-10-CM | POA: Insufficient documentation

## 2011-09-02 DIAGNOSIS — C569 Malignant neoplasm of unspecified ovary: Secondary | ICD-10-CM | POA: Insufficient documentation

## 2011-09-02 DIAGNOSIS — M797 Fibromyalgia: Secondary | ICD-10-CM | POA: Insufficient documentation

## 2011-09-02 DIAGNOSIS — J45909 Unspecified asthma, uncomplicated: Secondary | ICD-10-CM | POA: Insufficient documentation

## 2011-09-02 DIAGNOSIS — E782 Mixed hyperlipidemia: Secondary | ICD-10-CM

## 2011-09-02 DIAGNOSIS — I10 Essential (primary) hypertension: Secondary | ICD-10-CM

## 2011-09-02 MED ORDER — SIMVASTATIN 40 MG PO TABS: 40.0000 mg | ORAL_TABLET | Freq: Every day | ORAL | Status: DC

## 2011-09-02 NOTE — Assessment & Plan Note (Signed)
Blood pressure control has been marginal.  Patient will monitor over the next 4 weeks and return a list of blood pressures to [place] to determine if she needs additional antihypertensive medication.  Metoprolol is being provided at minimal doses and will be discontinued.  Addition of an ACE inhibitor will likely be appropriate.

## 2011-09-02 NOTE — Assessment & Plan Note (Addendum)
No apparent recurrence of arrhythmia since hospital discharge.  Amiodarone will be discontinued.  Levothyroxine was added to patient's medical therapy during her SNF stay.  She stopped treatment upon discharge, and a recent TSH level was normal.  She does not appear to have hypothyroidism.

## 2011-09-02 NOTE — Progress Notes (Signed)
Patient ID: [name], female   DOB: Sep 23, 1932, 77 y.o.   MRN: [id number] HPI: Return visit for this lovely woman who is the mother of a former patient of mine who underwent successful heart transplantation and was herself diagnosed with coronary disease requiring CABG surgery approximately 2 months ago.  She required a brief period of rehabilitation in a SNF, but has subsequently done very well.  Outpatient cardiac rehabilitation as planned.  She denies all cardiopulmonary symptoms and wishes to return to work.    Prior to Admission medications   Medication Sig Start Date End Date Taking? Authorizing Provider  aspirin 325 MG tablet Take 325 mg by mouth daily.     Yes Historical Provider, MD  levothyroxine (SYNTHROID, LEVOTHROID) 50 MCG tablet Take 50 mcg by mouth daily.     Yes Historical Provider, MD  metoprolol tartrate (LOPRESSOR) 12.5 mg TABS Take 0.5 tablets (12.5 mg total) by mouth 2 (two) times daily. 07/22/11  Yes Donielle Margaretann Loveless, PA  simvastatin (ZOCOR) 20 MG tablet Take 1 tablet (20 mg total) by mouth at bedtime. 07/22/11 07/21/12 Yes Donielle Margaretann Loveless, PA  traMADol (ULTRAM) 50 MG tablet Take 50 mg by mouth every 4 (four) hours as needed. Maximum dose= 8 tablets per day    Yes Historical Provider, MD    Allergies  Allergen Reactions  . Sulfa Antibiotics       Past medical history, social history, and family history reviewed and updated.  ROS: Denies chest discomfort, orthopnea, PND, pedal edema, palpitations, lightheadedness or syncope.  She was recently told of borderline blood pressure, but has not been monitoring this at home.  She complains about the number of medications that she is taking.  PHYSICAL EXAM: BP 148/84  Pulse 69  Ht 5' 5.5" (1.664 m)  Wt 56.246 kg (124 lb)  BMI 20.32 kg/m2; repeat blood pressure 135/80 General-Well developed; no acute distress Body habitus-Thin Neck-No JVD; no carotid bruits Chest-well-healed median sternotomy scar with stable  sternum and no tenderness. Lungs-clear lung fields; resonant to percussion Cardiovascular-normal PMI; normal S1 and S2; 4th heart sound present Abdomen-normal bowel sounds; soft and non-tender without masses or organomegaly Musculoskeletal-No deformities, no cyanosis or clubbing Neurologic-Normal cranial nerves; symmetric strength and tone Skin-Warm, no significant lesions Extremities-distal pulses intact; no edema  EKG: Tracing recorded 08/04/11 reviewed .  Normal sinus rhythm, left atrial abnormality, right bundle branch block, low voltage in the chest leads, left anterior fascicular block, delayed R-wave progression-cannot exclude prior anterolateral MI.  No previous tracing for comparison.  ASSESSMENT AND PLAN:  [place] Bing, MD 09/02/2011 11:49 AM

## 2011-09-02 NOTE — Patient Instructions (Signed)
Your physician recommends that you schedule a follow-up appointment in: 3 months  Your physician has requested that you regularly monitor and record your blood pressure readings at home. Please use the same machine at the same time of day to check your readings and record them to bring to [place] in 1 month.   Your physician recommends that you return for lab work in: 2 months (Cholesterol)   Return to work with < 10 pound lifting restriction

## 2011-09-02 NOTE — Assessment & Plan Note (Signed)
No recent lipid profile available.  Simvastatin will be increased to 40 mg per day and lipids reassessed in one month.

## 2011-09-02 NOTE — Assessment & Plan Note (Addendum)
Patient is doing well following recovery from CABG surgery.  Our focus will be on optimal control of cardiovascular risk factors.  She is advised that she may return to work as soon she feels up to it, but should restrict lifting to 20 pounds.

## 2011-09-13 ENCOUNTER — Encounter (HOSPITAL_COMMUNITY)
Admission: RE | Admit: 2011-09-13 | Discharge: 2011-09-13 | Disposition: A | Discharge status: Home / Self Care | Payer: Medicare Other | Source: Ambulatory Visit | Attending: Oncology | Admitting: Oncology

## 2011-09-15 ENCOUNTER — Encounter (HOSPITAL_COMMUNITY)
Admission: RE | Admit: 2011-09-15 | Discharge: 2011-09-15 | Disposition: A | Discharge status: Home / Self Care | Payer: Medicare Other | Source: Ambulatory Visit | Attending: Oncology | Admitting: Oncology

## 2011-09-17 ENCOUNTER — Encounter (HOSPITAL_COMMUNITY): Payer: Medicare Other

## 2011-09-20 ENCOUNTER — Encounter (HOSPITAL_COMMUNITY)
Admission: RE | Admit: 2011-09-20 | Discharge: 2011-09-20 | Disposition: A | Discharge status: Home / Self Care | Payer: Medicare Other | Source: Ambulatory Visit | Attending: Oncology | Admitting: Oncology

## 2011-09-22 ENCOUNTER — Encounter (HOSPITAL_COMMUNITY)
Admission: RE | Admit: 2011-09-22 | Discharge: 2011-09-22 | Disposition: A | Discharge status: Home / Self Care | Payer: Medicare Other | Source: Ambulatory Visit | Attending: Oncology | Admitting: Oncology

## 2011-09-24 ENCOUNTER — Encounter (HOSPITAL_COMMUNITY): Payer: Medicare Other

## 2011-09-24 NOTE — Progress Notes (Signed)
Cardiac Rehabilitation Program Progress Report   Orientation:  08/24/2011 Graduate Date:  tbd Discharge Date:  tbd # of sessions completed: 3  Cardiologist: [place] Bing Family MD:  Lilyan Punt Class Time:  08:15  A.  Exercise Program:  Tolerates exercise @ 2.4 METS for 15 minutes  B.  Mental Health:  Good mental attitude  C.  Education/Instruction/Skills  Knows THR for exercise and Uses Perceived Exertion Scale and/or Dyspnea Scale  Uses Perceived Exertion Scale and/or Dyspnea Scale  D.  Nutrition/Weight Control/Body Composition:  Adherence to prescribed nutrition program: good   *This section completed by Mickle Plumb, Andres Shad, RD, LDN, CDE  E.  Blood Lipids    Lab Results  Component Value Date   CHOL 176 07/07/2011     Lab Results  Component Value Date   TRIG 194* 07/07/2011     Lab Results  Component Value Date   HDL 51 07/07/2011     Lab Results  Component Value Date   CHOLHDL 3.5 07/07/2011     No results found for this basename: LDLDIRECT      F.  Lifestyle Changes:  Making positive lifestyle changes  G.  Symptoms noted with exercise:  Asymptomatic  Report Completed By:  Angelica Pou   Comments:  This is patients 1st week report. She achieved a peak mets of 2.4. Her resting HR is 85 and her resting BP is 118/72. Her peak Hr is 99 and Her peak BP is 164/60. She is motivated to exercise. A halfway report will follow.

## 2011-09-27 ENCOUNTER — Encounter (HOSPITAL_COMMUNITY)
Admission: RE | Admit: 2011-09-27 | Discharge: 2011-09-27 | Disposition: A | Discharge status: Home / Self Care | Payer: Medicare Other | Source: Ambulatory Visit | Attending: Oncology | Admitting: Oncology

## 2011-09-29 ENCOUNTER — Encounter (HOSPITAL_COMMUNITY)
Admission: RE | Admit: 2011-09-29 | Discharge: 2011-09-29 | Disposition: A | Discharge status: Home / Self Care | Payer: Medicare Other | Source: Ambulatory Visit | Attending: Oncology | Admitting: Oncology

## 2011-10-01 ENCOUNTER — Encounter (HOSPITAL_COMMUNITY): Payer: Medicare Other

## 2011-10-04 ENCOUNTER — Encounter (HOSPITAL_COMMUNITY)
Admission: RE | Admit: 2011-10-04 | Discharge: 2011-10-04 | Disposition: A | Discharge status: Home / Self Care | Payer: Medicare Other | Source: Ambulatory Visit | Attending: Oncology | Admitting: Oncology

## 2011-10-06 ENCOUNTER — Encounter (HOSPITAL_COMMUNITY)
Admission: RE | Admit: 2011-10-06 | Discharge: 2011-10-06 | Disposition: A | Discharge status: Home / Self Care | Payer: Medicare Other | Source: Ambulatory Visit | Attending: Oncology | Admitting: Oncology

## 2011-10-08 ENCOUNTER — Encounter (HOSPITAL_COMMUNITY)
Admission: RE | Admit: 2011-10-08 | Discharge: 2011-10-08 | Disposition: A | Discharge status: Home / Self Care | Payer: Medicare Other | Source: Ambulatory Visit | Attending: Cardiology | Admitting: Cardiology

## 2011-10-08 DIAGNOSIS — I4891 Unspecified atrial fibrillation: Secondary | ICD-10-CM | POA: Insufficient documentation

## 2011-10-08 DIAGNOSIS — I2510 Atherosclerotic heart disease of native coronary artery without angina pectoris: Secondary | ICD-10-CM | POA: Insufficient documentation

## 2011-10-08 DIAGNOSIS — I998 Other disorder of circulatory system: Secondary | ICD-10-CM | POA: Insufficient documentation

## 2011-10-08 DIAGNOSIS — Z5189 Encounter for other specified aftercare: Secondary | ICD-10-CM | POA: Insufficient documentation

## 2011-10-08 DIAGNOSIS — Z85038 Personal history of other malignant neoplasm of large intestine: Secondary | ICD-10-CM | POA: Insufficient documentation

## 2011-10-08 DIAGNOSIS — I10 Essential (primary) hypertension: Secondary | ICD-10-CM | POA: Insufficient documentation

## 2011-10-11 ENCOUNTER — Encounter (HOSPITAL_COMMUNITY)
Admission: RE | Admit: 2011-10-11 | Discharge: 2011-10-11 | Disposition: A | Discharge status: Home / Self Care | Payer: Medicare Other | Source: Ambulatory Visit | Attending: Oncology | Admitting: Oncology

## 2011-10-13 ENCOUNTER — Encounter (HOSPITAL_COMMUNITY)
Admission: RE | Admit: 2011-10-13 | Discharge: 2011-10-13 | Disposition: A | Discharge status: Home / Self Care | Payer: Medicare Other | Source: Ambulatory Visit | Attending: Oncology | Admitting: Oncology

## 2011-10-15 ENCOUNTER — Encounter (HOSPITAL_COMMUNITY)
Admission: RE | Admit: 2011-10-15 | Discharge: 2011-10-15 | Disposition: A | Discharge status: Home / Self Care | Payer: Medicare Other | Source: Ambulatory Visit | Attending: Oncology | Admitting: Oncology

## 2011-10-18 ENCOUNTER — Encounter (HOSPITAL_COMMUNITY): Payer: Self-pay

## 2011-10-18 ENCOUNTER — Encounter (HOSPITAL_COMMUNITY)
Admission: RE | Admit: 2011-10-18 | Discharge: 2011-10-18 | Disposition: A | Discharge status: Home / Self Care | Payer: Medicare Other | Source: Ambulatory Visit | Attending: Oncology | Admitting: Oncology

## 2011-10-18 ENCOUNTER — Encounter (HOSPITAL_COMMUNITY): Payer: Medicare Other

## 2011-10-18 DIAGNOSIS — C189 Malignant neoplasm of colon, unspecified: Secondary | ICD-10-CM | POA: Insufficient documentation

## 2011-10-18 LAB — GLUCOSE, CAPILLARY: Glucose-Capillary: 97 mg/dL (ref 70–99)

## 2011-10-18 MED ORDER — FLUDEOXYGLUCOSE F - 18 (FDG) INJECTION
15.5000 | Freq: Once | INTRAVENOUS | Status: AC | PRN
  Administered 2011-10-18: 15.5 via INTRAVENOUS

## 2011-10-20 ENCOUNTER — Encounter (HOSPITAL_COMMUNITY)
Admission: RE | Admit: 2011-10-20 | Discharge: 2011-10-20 | Disposition: A | Discharge status: Home / Self Care | Payer: Medicare Other | Source: Ambulatory Visit | Attending: Oncology | Admitting: Oncology

## 2011-10-20 ENCOUNTER — Encounter (HOSPITAL_BASED_OUTPATIENT_CLINIC_OR_DEPARTMENT_OTHER): Payer: Medicare Other | Admitting: Oncology

## 2011-10-20 VITALS — BP 126/78 | HR 91 | Temp 98.0°F | Wt 127.2 lb

## 2011-10-20 DIAGNOSIS — C50919 Malignant neoplasm of unspecified site of unspecified female breast: Secondary | ICD-10-CM

## 2011-10-20 DIAGNOSIS — C189 Malignant neoplasm of colon, unspecified: Secondary | ICD-10-CM

## 2011-10-20 DIAGNOSIS — I878 Other specified disorders of veins: Secondary | ICD-10-CM

## 2011-10-20 MED ORDER — SODIUM CHLORIDE 0.9 % IJ SOLN
10.0000 mL | INTRAMUSCULAR | Status: DC | PRN
  Administered 2011-10-20: 10 mL via INTRAVENOUS
  Filled 2011-10-20: qty 10

## 2011-10-20 MED ORDER — HEPARIN SOD (PORK) LOCK FLUSH 100 UNIT/ML IV SOLN
500.0000 [IU] | Freq: Once | INTRAVENOUS | Status: AC
  Administered 2011-10-20: 500 [IU] via INTRAVENOUS
  Filled 2011-10-20: qty 5

## 2011-10-20 MED ORDER — HEPARIN SOD (PORK) LOCK FLUSH 100 UNIT/ML IV SOLN
INTRAVENOUS | Status: AC
  Administered 2011-10-20: 500 [IU] via INTRAVENOUS
  Filled 2011-10-20: qty 5

## 2011-10-20 MED ORDER — SODIUM CHLORIDE 0.9 % IJ SOLN
INTRAMUSCULAR | Status: AC
  Administered 2011-10-20: 10 mL via INTRAVENOUS
  Filled 2011-10-20: qty 10

## 2011-10-20 NOTE — Progress Notes (Signed)
[name] presented for Portacath access and flush. Proper placement of portacath confirmed by CXR. Portacath located right chest wall accessed with  H 20 needle. Good blood return present. Portacath flushed with 20ml NS and 500U/5ml Heparin and needle removed intact. Procedure without incident. Patient tolerated procedure well.   

## 2011-10-20 NOTE — Patient Instructions (Addendum)
[name]  [id number] 1933/06/22   [facility]  Discharge Instructions  RECOMMENDATIONS MADE BY THE CONSULTANT AND ANY TEST RESULTS WILL BE SENT TO YOUR REFERRING DOCTOR.   EXAM FINDINGS BY MD TODAY AND SIGNS AND SYMPTOMS TO REPORT TO CLINIC OR PRIMARY MD: Your PET Scan was great!  Will see you back in 12 weeks.  MEDICATIONS PRESCRIBED: none   INSTRUCTIONS GIVEN AND DISCUSSED: Other:  Report changes in bowel habits, blood in your bowel movements, shortness of breath, etc.  SPECIAL INSTRUCTIONS/FOLLOW-UP: Return to Clinic for port flush and lab work and MD in 3 months.   I acknowledge that I have been informed and understand all the instructions given to me and received a copy. I do not have any more questions at this time, but understand that I may call the Specialty Clinic at [facility] at [phone] during business hours should I have any further questions or need assistance in obtaining follow-up care.    __________________________________________  _____________  __________ Signature of Patient or Authorized Representative            Date                   Time    __________________________________________ Nurse's Signature

## 2011-10-20 NOTE — Progress Notes (Signed)
CC:   Scott A. Gerda Diss, MD [name], MD, [facility] Leone Haven, MD  DIAGNOSES: 1. History of metastatic recurrent cancer of the colon and she is now     status post 6 cycles of capecitabine finishing her last dose in     September 2012.  She was being treated for a recurrence in the     pelvis close to the pubic symphysis that was 1.3 cm in size and was     PET avid.  The PET scan done yesterday shows no evidence for     persistence of disease. 2. Coronary artery bypass graft within last 3 months.  She was said to     have had a small myocardial infarction. 3. Hyperlipidemia. 4. History of hypertension. 5. History of atrial fibrillation. 6. History of atherosclerotic cardiovsacular disease.  [name] had blood work done on December 14th, 2012, still showing a normal CEA and normal CBC, CMET that was essentially normal as well. 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 Developed terrific left upper arm pain down to the left elbow.  Went to see Dr. Gerda Diss who got her admitted for a possible myocardial infarction and it appears that she probably did have a small anterolateral MI on 07/27/2011, but went on to have definitive surgery by Dr. Andrey Spearman consisting of a CABG.  She is here today for followup.  She is due for blood work in a month anyway so I did not do it today.  We have her already scheduled for that but her PET scan was done and really is quite impressively unremarkable.  The only uptake that is different is that she has, near one of the vein harvesting sites in the left thigh, a little bit of uptake and this is just where there is one of the scars on physical exam today.  I did not examine her further.  She is asymptomatic.  Vital signs are all stable.  She looks great.  I will see her in 3 months.  I will examine her then. We will get her blood work in March, of  course, and I will probably reconsider a PET scan, probably in 6 months from now.    ______________________________ Ladona Horns. Mariel Sleet, MD ESN/MEDQ  D:  10/20/2011  T:  10/20/2011  Job:  [id number]

## 2011-10-20 NOTE — Progress Notes (Signed)
This office note has been dictated.

## 2011-10-22 ENCOUNTER — Encounter (HOSPITAL_COMMUNITY)
Admission: RE | Admit: 2011-10-22 | Discharge: 2011-10-22 | Disposition: A | Discharge status: Home / Self Care | Payer: Medicare Other | Source: Ambulatory Visit | Attending: Oncology | Admitting: Oncology

## 2011-10-25 ENCOUNTER — Encounter (HOSPITAL_COMMUNITY)
Admission: RE | Admit: 2011-10-25 | Discharge: 2011-10-25 | Disposition: A | Discharge status: Home / Self Care | Payer: Medicare Other | Source: Ambulatory Visit | Attending: Oncology | Admitting: Oncology

## 2011-10-27 ENCOUNTER — Encounter (HOSPITAL_COMMUNITY)
Admission: RE | Admit: 2011-10-27 | Discharge: 2011-10-27 | Disposition: A | Discharge status: Home / Self Care | Payer: Medicare Other | Source: Ambulatory Visit | Attending: Oncology | Admitting: Oncology

## 2011-10-29 ENCOUNTER — Encounter (HOSPITAL_COMMUNITY): Payer: Medicare Other

## 2011-11-01 ENCOUNTER — Telehealth: Payer: Self-pay | Admitting: *Deleted

## 2011-11-01 ENCOUNTER — Other Ambulatory Visit: Payer: Self-pay | Admitting: *Deleted

## 2011-11-01 ENCOUNTER — Encounter (HOSPITAL_COMMUNITY)
Admission: RE | Admit: 2011-11-01 | Discharge: 2011-11-01 | Disposition: A | Discharge status: Home / Self Care | Payer: Medicare Other | Source: Ambulatory Visit | Attending: Oncology | Admitting: Oncology

## 2011-11-01 DIAGNOSIS — E782 Mixed hyperlipidemia: Secondary | ICD-10-CM

## 2011-11-01 DIAGNOSIS — I10 Essential (primary) hypertension: Secondary | ICD-10-CM

## 2011-11-03 ENCOUNTER — Encounter (HOSPITAL_COMMUNITY): Payer: Medicare Other

## 2011-11-04 ENCOUNTER — Telehealth: Payer: Self-pay | Admitting: *Deleted

## 2011-11-05 ENCOUNTER — Encounter: Payer: Self-pay | Admitting: *Deleted

## 2011-11-05 ENCOUNTER — Encounter (HOSPITAL_COMMUNITY)
Admission: RE | Admit: 2011-11-05 | Discharge: 2011-11-05 | Disposition: A | Discharge status: Home / Self Care | Payer: Medicare Other | Source: Ambulatory Visit | Attending: Oncology | Admitting: Oncology

## 2011-11-05 DIAGNOSIS — Z5189 Encounter for other specified aftercare: Secondary | ICD-10-CM | POA: Insufficient documentation

## 2011-11-05 DIAGNOSIS — Z951 Presence of aortocoronary bypass graft: Secondary | ICD-10-CM | POA: Insufficient documentation

## 2011-11-05 DIAGNOSIS — I2510 Atherosclerotic heart disease of native coronary artery without angina pectoris: Secondary | ICD-10-CM | POA: Insufficient documentation

## 2011-11-05 LAB — COMPREHENSIVE METABOLIC PANEL
ALT: 8 U/L (ref 0–35)
CO2: 26 mEq/L (ref 19–32)
Calcium: 9.1 mg/dL (ref 8.4–10.5)
Chloride: 108 mEq/L (ref 96–112)
Sodium: 143 mEq/L (ref 135–145)
Total Protein: 6.3 g/dL (ref 6.0–8.3)

## 2011-11-05 LAB — LIPID PANEL
Cholesterol: 151 mg/dL (ref 0–200)
LDL Cholesterol: 62 mg/dL (ref 0–99)
Total CHOL/HDL Ratio: 2.7 Ratio
VLDL: 33 mg/dL (ref 0–40)

## 2011-11-08 ENCOUNTER — Encounter (HOSPITAL_COMMUNITY)
Admission: RE | Admit: 2011-11-08 | Discharge: 2011-11-08 | Disposition: A | Discharge status: Home / Self Care | Payer: Medicare Other | Source: Ambulatory Visit | Attending: Oncology | Admitting: Oncology

## 2011-11-10 ENCOUNTER — Encounter (HOSPITAL_COMMUNITY)
Admission: RE | Admit: 2011-11-10 | Discharge: 2011-11-10 | Disposition: A | Discharge status: Home / Self Care | Payer: Medicare Other | Source: Ambulatory Visit | Attending: Oncology | Admitting: Oncology

## 2011-11-12 ENCOUNTER — Other Ambulatory Visit (HOSPITAL_COMMUNITY): Payer: Medicare Other

## 2011-11-12 ENCOUNTER — Encounter (HOSPITAL_COMMUNITY): Payer: Medicare Other

## 2011-11-15 ENCOUNTER — Encounter (HOSPITAL_COMMUNITY): Payer: Medicare Other

## 2011-11-17 ENCOUNTER — Encounter (HOSPITAL_COMMUNITY)
Admission: RE | Admit: 2011-11-17 | Discharge: 2011-11-17 | Disposition: A | Discharge status: Home / Self Care | Payer: Medicare Other | Source: Ambulatory Visit | Attending: Oncology | Admitting: Oncology

## 2011-11-19 ENCOUNTER — Encounter (HOSPITAL_COMMUNITY)
Admission: RE | Admit: 2011-11-19 | Discharge: 2011-11-19 | Disposition: A | Discharge status: Home / Self Care | Payer: Medicare Other | Source: Ambulatory Visit | Attending: Oncology | Admitting: Oncology

## 2011-11-19 DIAGNOSIS — I25810 Atherosclerosis of coronary artery bypass graft(s) without angina pectoris: Secondary | ICD-10-CM

## 2011-11-22 ENCOUNTER — Encounter (HOSPITAL_COMMUNITY)
Admission: RE | Admit: 2011-11-22 | Discharge: 2011-11-22 | Disposition: A | Discharge status: Home / Self Care | Payer: Medicare Other | Source: Ambulatory Visit | Attending: Oncology | Admitting: Oncology

## 2011-11-22 ENCOUNTER — Other Ambulatory Visit (HOSPITAL_COMMUNITY): Payer: Medicare Other

## 2011-11-24 ENCOUNTER — Encounter (HOSPITAL_COMMUNITY)
Admission: RE | Admit: 2011-11-24 | Discharge: 2011-11-24 | Disposition: A | Discharge status: Home / Self Care | Payer: Medicare Other | Source: Ambulatory Visit | Attending: Oncology | Admitting: Oncology

## 2011-11-26 ENCOUNTER — Encounter (HOSPITAL_COMMUNITY): Payer: Medicare Other

## 2011-11-29 ENCOUNTER — Encounter (HOSPITAL_COMMUNITY)
Admission: RE | Admit: 2011-11-29 | Discharge: 2011-11-29 | Disposition: A | Discharge status: Home / Self Care | Payer: Medicare Other | Source: Ambulatory Visit | Attending: Oncology | Admitting: Oncology

## 2011-12-01 ENCOUNTER — Encounter (HOSPITAL_COMMUNITY)
Admission: RE | Admit: 2011-12-01 | Discharge: 2011-12-01 | Disposition: A | Discharge status: Home / Self Care | Payer: Medicare Other | Source: Ambulatory Visit | Attending: Oncology | Admitting: Oncology

## 2011-12-02 ENCOUNTER — Ambulatory Visit: Payer: Medicare Other | Admitting: Cardiology

## 2011-12-03 ENCOUNTER — Encounter (HOSPITAL_COMMUNITY): Payer: Medicare Other

## 2011-12-06 ENCOUNTER — Encounter (HOSPITAL_COMMUNITY): Payer: Medicare Other

## 2011-12-08 ENCOUNTER — Encounter (HOSPITAL_COMMUNITY)
Admission: RE | Admit: 2011-12-08 | Discharge: 2011-12-08 | Disposition: A | Discharge status: Home / Self Care | Payer: Medicare Other | Source: Ambulatory Visit | Attending: Oncology | Admitting: Oncology

## 2011-12-08 DIAGNOSIS — C189 Malignant neoplasm of colon, unspecified: Secondary | ICD-10-CM | POA: Insufficient documentation

## 2011-12-08 DIAGNOSIS — I4891 Unspecified atrial fibrillation: Secondary | ICD-10-CM | POA: Insufficient documentation

## 2011-12-08 DIAGNOSIS — I252 Old myocardial infarction: Secondary | ICD-10-CM | POA: Insufficient documentation

## 2011-12-08 DIAGNOSIS — M6281 Muscle weakness (generalized): Secondary | ICD-10-CM | POA: Insufficient documentation

## 2011-12-08 DIAGNOSIS — I10 Essential (primary) hypertension: Secondary | ICD-10-CM | POA: Insufficient documentation

## 2011-12-08 DIAGNOSIS — I2510 Atherosclerotic heart disease of native coronary artery without angina pectoris: Secondary | ICD-10-CM | POA: Insufficient documentation

## 2011-12-10 ENCOUNTER — Encounter (HOSPITAL_COMMUNITY)
Admission: RE | Admit: 2011-12-10 | Discharge: 2011-12-10 | Disposition: A | Discharge status: Home / Self Care | Payer: Medicare Other | Source: Ambulatory Visit | Attending: Oncology | Admitting: Oncology

## 2011-12-13 ENCOUNTER — Encounter (HOSPITAL_COMMUNITY)
Admission: RE | Admit: 2011-12-13 | Discharge: 2011-12-13 | Disposition: A | Discharge status: Home / Self Care | Payer: Medicare Other | Source: Ambulatory Visit | Attending: Oncology | Admitting: Oncology

## 2011-12-15 ENCOUNTER — Encounter (HOSPITAL_COMMUNITY)
Admission: RE | Admit: 2011-12-15 | Discharge: 2011-12-15 | Disposition: A | Discharge status: Home / Self Care | Payer: Medicare Other | Source: Ambulatory Visit | Attending: Oncology | Admitting: Oncology

## 2011-12-17 ENCOUNTER — Encounter (HOSPITAL_COMMUNITY)
Admission: RE | Admit: 2011-12-17 | Discharge: 2011-12-17 | Disposition: A | Discharge status: Home / Self Care | Payer: Medicare Other | Source: Ambulatory Visit | Attending: Oncology | Admitting: Oncology

## 2011-12-20 ENCOUNTER — Encounter (HOSPITAL_COMMUNITY)
Admission: RE | Admit: 2011-12-20 | Discharge: 2011-12-20 | Disposition: A | Discharge status: Home / Self Care | Payer: Medicare Other | Source: Ambulatory Visit | Attending: Oncology | Admitting: Oncology

## 2011-12-20 ENCOUNTER — Ambulatory Visit (INDEPENDENT_AMBULATORY_CARE_PROVIDER_SITE_OTHER): Payer: Medicare Other | Admitting: Cardiology

## 2011-12-20 ENCOUNTER — Encounter: Payer: Self-pay | Admitting: Cardiology

## 2011-12-20 VITALS — BP 122/76 | HR 82 | Resp 16 | Ht 65.0 in | Wt 126.0 lb

## 2011-12-20 DIAGNOSIS — C569 Malignant neoplasm of unspecified ovary: Secondary | ICD-10-CM

## 2011-12-20 DIAGNOSIS — I2510 Atherosclerotic heart disease of native coronary artery without angina pectoris: Secondary | ICD-10-CM

## 2011-12-20 NOTE — Progress Notes (Signed)
Name: [name]    DOB: September 28, 1932  Age: 78 y.o.  MR#: [id number]       PCP:  Lilyan Punt, MD, MD      Insurance: @PAYORNAME @   CC:    Chief Complaint  Patient presents with  . Appointment    no complaints +meds    VS BP 122/76  Pulse 82  Resp 16  Ht 5\' 5"  (1.651 m)  Wt 126 lb (57.153 kg)  BMI 20.97 kg/m2  Weights Current Weight  12/20/11 126 lb (57.153 kg)  10/20/11 127 lb 3.2 oz (57.698 kg)  09/02/11 124 lb (56.246 kg)    Blood Pressure  BP Readings from Last 3 Encounters:  12/20/11 122/76  10/20/11 126/78  09/02/11 148/84     Admit date:  (Not on file) Last encounter with RMR:  09/02/2011   Allergy Allergies  Allergen Reactions  . Sulfa Antibiotics     Current Outpatient Prescriptions  Medication Sig Dispense Refill  . aspirin 325 MG tablet Take 325 mg by mouth daily.        Marland Kitchen atorvastatin (LIPITOR) 20 MG tablet Take 20 mg by mouth daily.        Discontinued Meds:    Medications Discontinued During This Encounter  Medication Reason  . simvastatin (ZOCOR) 40 MG tablet Error  . traMADol (ULTRAM) 50 MG tablet Error    Patient Active Problem List  Diagnoses  . Recurrent adenocarcinoma of colon  . Hypertension  . Ovarian cancer  . History of polymyalgia rheumatica  . Rheumatoid arthritis  . Fibromyalgia  . Arteriosclerotic cardiovascular disease (ASCVD)  . Atrial fibrillation  . Hyperlipidemia  . Asthmatic bronchitis    LABS Orders Only on 11/01/2011  Component Date Value  . Sodium 11/04/2011 143   . Potassium 11/04/2011 4.7   . Chloride 11/04/2011 108   . CO2 11/04/2011 26   . Glucose, Bld 11/04/2011 90   . BUN 11/04/2011 18   . Creat 11/04/2011 0.63   . Total Bilirubin 11/04/2011 1.4*  . Alkaline Phosphatase 11/04/2011 67   . AST 11/04/2011 17   . ALT 11/04/2011 8   . Total Protein 11/04/2011 6.3   . Albumin 11/04/2011 4.3   . Calcium 11/04/2011 9.1   . Cholesterol 11/04/2011 151   . Triglycerides 11/04/2011 166*  . HDL  11/04/2011 56   . Total CHOL/HDL Ratio 11/04/2011 2.7   . VLDL 11/04/2011 33   . LDL Cholesterol 11/04/2011 62   Hospital Outpatient Visit on 10/18/2011  Component Date Value  . Glucose-Capillary 10/18/2011 97      Results for this Opt Visit:     Results for orders placed in visit on 11/01/11  COMPREHENSIVE METABOLIC PANEL      Component Value Range   Sodium 143  135 - 145 (mEq/L)   Potassium 4.7  3.5 - 5.3 (mEq/L)   Chloride 108  96 - 112 (mEq/L)   CO2 26  19 - 32 (mEq/L)   Glucose, Bld 90  70 - 99 (mg/dL)   BUN 18  6 - 23 (mg/dL)   Creat 4.09  8.11 - 9.14 (mg/dL)   Total Bilirubin 1.4 (*) 0.3 - 1.2 (mg/dL)   Alkaline Phosphatase 67  39 - 117 (U/L)   AST 17  0 - 37 (U/L)   ALT 8  0 - 35 (U/L)   Total Protein 6.3  6.0 - 8.3 (g/dL)   Albumin 4.3  3.5 - 5.2 (g/dL)   Calcium 9.1  8.4 -  10.5 (mg/dL)  LIPID PANEL      Component Value Range   Cholesterol 151  0 - 200 (mg/dL)   Triglycerides 161 (*) <150 (mg/dL)   HDL 56  >09 (mg/dL)   Total CHOL/HDL Ratio 2.7     VLDL 33  0 - 40 (mg/dL)   LDL Cholesterol 62  0 - 99 (mg/dL)    EKG Orders placed in visit on 08/04/11  . EKG 12-LEAD     Prior Assessment and Plan Problem List as of 12/20/2011          Cardiology Problems   Hypertension   Last Assessment & Plan Note   09/02/2011 Office Visit Signed 09/02/2011  4:47 PM by Kathlen Brunswick, MD    Blood pressure control has been marginal.  Patient will monitor over the next 4 weeks and return a list of blood pressures to [place] to determine if she needs additional antihypertensive medication.  Metoprolol is being provided at minimal doses and will be discontinued.  Addition of an ACE inhibitor will likely be appropriate.    Arteriosclerotic cardiovascular disease (ASCVD)   Last Assessment & Plan Note   09/02/2011 Office Visit Addendum 09/12/2011 12:11 PM by Kathlen Brunswick, MD    Patient is doing well following recovery from CABG surgery.  Our focus will be on optimal control of  cardiovascular risk factors.  She is advised that she may return to work as soon she feels up to it, but should restrict lifting to 20 pounds.    Atrial fibrillation   Last Assessment & Plan Note   09/02/2011 Office Visit Addendum 09/02/2011  4:49 PM by Kathlen Brunswick, MD    No apparent recurrence of arrhythmia since hospital discharge.  Amiodarone will be discontinued.  Levothyroxine was added to patient's medical therapy during her SNF stay.  She stopped treatment upon discharge, and a recent TSH level was normal.  She does not appear to have hypothyroidism.    Hyperlipidemia   Last Assessment & Plan Note   09/02/2011 Office Visit Signed 09/02/2011  4:46 PM by Kathlen Brunswick, MD    No recent lipid profile available.  Simvastatin will be increased to 40 mg per day and lipids reassessed in one month.      Other   Recurrent adenocarcinoma of colon   Ovarian cancer   History of polymyalgia rheumatica   Rheumatoid arthritis   Fibromyalgia   Asthmatic bronchitis       Imaging: No results found.   FRS Calculation: Score not calculated. Missing: Total Cholesterol

## 2011-12-20 NOTE — Assessment & Plan Note (Addendum)
No evidence of disease after multiple surgeries, substantial chemotherapy and multiple recurrences.

## 2011-12-20 NOTE — Patient Instructions (Signed)
Your physician recommends that you schedule a follow-up appointment in: 1 YEAR  Your physician has recommended you make the following change in your medication:  1 - CHANGE ASPIRIN TO 162 MG DAILY 2 - START FISH OIL 1 CAPSULE TWICE A DAY

## 2011-12-20 NOTE — Progress Notes (Signed)
Patient ID: [name], female   DOB: 26-Oct-1932, 78 y.o.   MRN: [id number]  HPI: Return visit for this lovely woman now 6 months out from CABG surgery.  She has done superbly, is exercising well in the cardiac rehabilitation program and denies all cardiopulmonary symptoms.  She has suffered from both colon and ovarian carcinoma, but is currently free of demonstrable disease.  Her only complaint is that she has to take 2 medicines on a daily basis.  Prior to Admission medications   Medication Sig Start Date End Date Taking? Authorizing Provider  aspirin 162 MG EC tablet Take 162 mg by mouth daily.   Yes Historical Provider, MD  atorvastatin (LIPITOR) 20 MG tablet Take 20 mg by mouth daily.   Yes Historical Provider, MD  fish oil-omega-3 fatty acids 1000 MG capsule Take 1 g by mouth 2 (two) times daily.   Yes Historical Provider, MD   Allergies  Allergen Reactions  . Sulfa Antibiotics      Past medical history, social history, and family history reviewed and updated.  ROS: Denies chest pain, dyspnea, orthopnea, PND and pedal edema.  PHYSICAL EXAM: BP 122/76  Pulse 82  Resp 16  Ht 5\' 5"  (1.651 m)  Wt 57.153 kg (126 lb)  BMI 20.97 kg/m2  General-Well developed; no acute distress Body habitus-Thin Neck-No JVD; no carotid bruits Lungs-clear lung fields; resonant to percussion Cardiovascular-normal PMI; normal S1 and S2; S4 present Abdomen-normal bowel sounds; soft and non-tender without masses or organomegaly Musculoskeletal-No deformities, no cyanosis or clubbing Neurologic-Normal cranial nerves; symmetric strength and tone Skin-Warm, no significant lesions Extremities-distal pulses intact; no edema  ASSESSMENT AND PLAN:  [place] Bing, MD 12/20/2011 2:17 PM

## 2011-12-20 NOTE — Assessment & Plan Note (Addendum)
Patient is doing superbly, having almost completed the cardiac rehabilitation program.  She is asymptomatic from a cardiac standpoint.  Her only risk factor that can be addressed is hyperlipidemia, and control of that is excellent.  Fish oil will be added to her medical regime.  Patient is confused with respect to the dose of aspirin that she should be taking.  162 mg per day will be prescribed for the next 6 months after which 81 mg per day will be adequate.

## 2011-12-22 ENCOUNTER — Encounter (HOSPITAL_COMMUNITY)
Admission: RE | Admit: 2011-12-22 | Discharge: 2011-12-22 | Disposition: A | Discharge status: Home / Self Care | Payer: Medicare Other | Source: Ambulatory Visit | Attending: Oncology | Admitting: Oncology

## 2011-12-24 ENCOUNTER — Encounter (HOSPITAL_COMMUNITY): Payer: Medicare Other

## 2011-12-27 ENCOUNTER — Encounter (HOSPITAL_COMMUNITY)
Admission: RE | Admit: 2011-12-27 | Discharge: 2011-12-27 | Disposition: A | Discharge status: Home / Self Care | Payer: Medicare Other | Source: Ambulatory Visit | Attending: Oncology | Admitting: Oncology

## 2011-12-29 ENCOUNTER — Encounter (HOSPITAL_COMMUNITY)
Admission: RE | Admit: 2011-12-29 | Discharge: 2011-12-29 | Disposition: A | Discharge status: Home / Self Care | Payer: Medicare Other | Source: Ambulatory Visit | Attending: Oncology | Admitting: Oncology

## 2011-12-31 ENCOUNTER — Encounter (HOSPITAL_COMMUNITY): Payer: Medicare Other

## 2012-01-03 ENCOUNTER — Encounter (HOSPITAL_COMMUNITY): Payer: Medicare Other

## 2012-01-05 ENCOUNTER — Encounter (HOSPITAL_COMMUNITY): Payer: Medicare Other

## 2012-01-07 ENCOUNTER — Encounter (HOSPITAL_COMMUNITY): Payer: Medicare Other

## 2012-01-10 ENCOUNTER — Encounter (HOSPITAL_COMMUNITY): Payer: Medicare Other

## 2012-01-12 ENCOUNTER — Encounter (HOSPITAL_COMMUNITY)
Admission: RE | Admit: 2012-01-12 | Discharge: 2012-01-12 | Disposition: A | Discharge status: Home / Self Care | Payer: Medicare Other | Source: Ambulatory Visit | Attending: Cardiology | Admitting: Cardiology

## 2012-01-12 DIAGNOSIS — I998 Other disorder of circulatory system: Secondary | ICD-10-CM | POA: Insufficient documentation

## 2012-01-12 DIAGNOSIS — C189 Malignant neoplasm of colon, unspecified: Secondary | ICD-10-CM | POA: Insufficient documentation

## 2012-01-14 ENCOUNTER — Encounter (HOSPITAL_COMMUNITY): Payer: Medicare Other

## 2012-01-17 ENCOUNTER — Encounter (HOSPITAL_COMMUNITY): Payer: Medicare Other

## 2012-01-17 NOTE — Progress Notes (Signed)
Cardiac Rehabilitation Program Outcomes Report   Orientation:  08/24/11 Graduate Date:  tbd Discharge Date:  tbd # of sessions completed: 18 [id number] Artery Bypass Graft X 5  Cardiologist: [place] Bing Family MD:  Lilyan Punt Class Time:  08:15  A.  Exercise Program:  Tolerates exercise @ 2.8 METS for 15 minutes  B.  Mental Health:  Good mental attitude  C.  Education/Instruction/Skills  Knows THR for exercise and Uses Perceived Exertion Scale and/or Dyspnea Scale  Uses Perceived Exertion Scale and/or Dyspnea Scale  D.  Nutrition/Weight Control/Body Composition:  Adherence to prescribed nutrition program: good    E.  Blood Lipids    Lab Results  Component Value Date   CHOL 151 11/04/2011   HDL 56 11/04/2011   LDLCALC 62 11/04/2011   TRIG 166* 11/04/2011   CHOLHDL 2.7 11/04/2011    F.  Lifestyle Changes:  Making positive lifestyle changes  G.  Symptoms noted with exercise:  Asymptomatic  Report Completed By:  Lelon Huh. Madysen Faircloth RN   Comments:  This is patients Halfway report. She Achieved a peak Mets of 2.8. Her resting HR is 78 and her resting BP is 128/68. Her peak HR is 109 and her peak BP is 138/70. A Graduation report will follow.

## 2012-01-17 NOTE — Progress Notes (Signed)
Cardiac Rehabilitation Program Outcomes Report   Orientation:  08/24/2011 Graduate Date:  01/12/2012 Discharge Date:  01/12/2012 # of sessions completed: 36 DX: [place] Artery Bypass Graft X5  Cardiologist: [place] Bing Family MD:  Lilyan Punt Class Time:  08:15  A.  Exercise Program:  Tolerates exercise @ 3.0 METS for 15 minutes and Discharged to home exercise program.  Anticipated compliance:  excellent  B.  Mental Health:  Good mental attitude  C.  Education/Instruction/Skills  Knows THR for exercise, Uses Perceived Exertion Scale and/or Dyspnea Scale and Attended all education classes  Uses Perceived Exertion Scale and/or Dyspnea Scale  D.  Nutrition/Weight Control/Body Composition:  Adherence to prescribed nutrition program: good    E.  Blood Lipids    Lab Results  Component Value Date   CHOL 151 11/04/2011   HDL 56 11/04/2011   LDLCALC 62 11/04/2011   TRIG 166* 11/04/2011   CHOLHDL 2.7 11/04/2011    F.  Lifestyle Changes:  Making positive lifestyle changes  G.  Symptoms noted with exercise:  Asymptomatic  Report Completed By:  Lelon Huh. Yarithza Mink RN   Comments:  Mrs Huxtable has progressed nicely to 30 minutes of aerobic exercise @ Max MET level of 3.0 and 10 minutes of strength and flexibility exercises. All Patients vitals are WNL. Patient did not meet with dietician. DC instructions have been reviewed in detail and patient verbalized understanding. Patient plans to join a local gym for follow up exercise. Cardiac Rehab staff will make F/U call backs at 1 month, 6 months, and 1 year. Patient had no C/O any abnormal S/S or pain while in rehab.

## 2012-01-24 ENCOUNTER — Encounter (HOSPITAL_BASED_OUTPATIENT_CLINIC_OR_DEPARTMENT_OTHER): Payer: Medicare Other | Admitting: Oncology

## 2012-01-24 ENCOUNTER — Encounter (HOSPITAL_COMMUNITY): Payer: Self-pay | Admitting: Oncology

## 2012-01-24 VITALS — BP 113/74 | HR 73 | Temp 98.2°F | Wt 127.2 lb

## 2012-01-24 DIAGNOSIS — I998 Other disorder of circulatory system: Secondary | ICD-10-CM

## 2012-01-24 DIAGNOSIS — I878 Other specified disorders of veins: Secondary | ICD-10-CM

## 2012-01-24 DIAGNOSIS — C189 Malignant neoplasm of colon, unspecified: Secondary | ICD-10-CM

## 2012-01-24 DIAGNOSIS — C779 Secondary and unspecified malignant neoplasm of lymph node, unspecified: Secondary | ICD-10-CM

## 2012-01-24 LAB — COMPREHENSIVE METABOLIC PANEL
ALT: 12 U/L (ref 0–35)
Albumin: 4 g/dL (ref 3.5–5.2)
Calcium: 9.5 mg/dL (ref 8.4–10.5)
GFR calc Af Amer: >90 mL/min (ref >90)
Glucose, Bld: 81 mg/dL (ref 70–99)
Potassium: 3.7 mEq/L (ref 3.5–5.1)
Sodium: 139 mEq/L (ref 135–145)
Total Protein: 6.9 g/dL (ref 6.0–8.3)

## 2012-01-24 LAB — CBC
MCH: 27.4 pg (ref 26.0–34.0)
Platelets: 193 10*3/uL (ref 150–400)
RBC: 4.97 MIL/uL (ref 3.87–5.11)
WBC: 6.4 10*3/uL (ref 4.0–10.5)

## 2012-01-24 LAB — DIFFERENTIAL
Eosinophils Relative: 1 % (ref 0–5)
Lymphocytes Relative: 27 % (ref 12–46)
Lymphs Abs: 1.7 10*3/uL (ref 0.7–4.0)
Monocytes Relative: 7 % (ref 3–12)

## 2012-01-24 MED ORDER — SODIUM CHLORIDE 0.9 % IJ SOLN
10.0000 mL | INTRAMUSCULAR | Status: DC | PRN
  Administered 2012-01-24: 10 mL via INTRAVENOUS
  Filled 2012-01-24: qty 10

## 2012-01-24 MED ORDER — SODIUM CHLORIDE 0.9 % IJ SOLN
INTRAMUSCULAR | Status: AC
  Filled 2012-01-24: qty 10

## 2012-01-24 MED ORDER — HEPARIN SOD (PORK) LOCK FLUSH 100 UNIT/ML IV SOLN
500.0000 [IU] | Freq: Once | INTRAVENOUS | Status: AC
  Administered 2012-01-24: 500 [IU] via INTRAVENOUS
  Filled 2012-01-24: qty 5

## 2012-01-24 MED ORDER — HEPARIN SOD (PORK) LOCK FLUSH 100 UNIT/ML IV SOLN
INTRAVENOUS | Status: AC
  Filled 2012-01-24: qty 5

## 2012-01-24 NOTE — Progress Notes (Signed)
Addended by: Sterling Big on: 01/24/2012 01:45 PM   Modules accepted: Orders

## 2012-01-24 NOTE — Progress Notes (Signed)
CC:   Scott A. Gerda Diss, MD [name], M.D. Thomas C. Wall, MD, [facility] Herb Hurowitz Tilda Burrow, M.D.  DIAGNOSES: 1. History of metastatic recurrent cancer of the colon now status post     6 cycles of capecitabine, finishing her last dose in September 2012     for recurrence in the pelvis close to the pubic symphysis 1.3 cm in     size that was PET avid.  Her most recent PET scan in February shows     no evidence for recurrent disease. 2. Coronary artery bypass graft in November 2012 after a small     myocardial infarction. 3. Hyperlipidemia. 4. Hypertension. 5. History of atrial fibrillation. 6. History of atherosclerotic cardiovascular disease.  [name] of course is out essentially 12 years from presentation with colon cancer, and in that period of time she has had several recurrences.  One was to the ovary and she most recently had this small lesion near the symphysis pubis.  She finished capecitabine in September, did well until she had this small MI, etc.  Today she is a little tired.  She does have numbness in both calves probably related to the vein graft harvesting.  She has no numbness in her toes, feet, ankles.  She has a little weakness and fatigability.  No chest pain, no shortness of breath.  Appetite is good.  She sleeps well most of the time unless something is bothering her emotionally.  She just got back from a trip to [place], [place], which she did well on but got a little flustered it sounds like coming through the busy areas of [place] and [place].  She is alert today.  She is oriented.  Her vital signs are all very stable.  She is in no pain.  Lungs are clear.  Port in the right upper chest wall is intact.  She has no adenopathy in the cervical, supraclavicular, infraclavicular, axillary or inguinal areas.  Heart shows regular rhythm and rate without distinct murmur, rub or gallop. There was a question of a mild diastolic murmur but it  was intermittent and it was extremely soft.  Her abdomen is soft and nontender without organomegaly.  All of her scars are well-healed.  There is no distention of her abdomen.  No swelling of her ankles or arms.  She looks great.  We will do a PET scan in 3 months.  I will see her right after that. Her labs are due today.  We will see what they show.    ______________________________ Ladona Horns. Mariel Sleet, MD ESN/MEDQ  D:  01/24/2012  T:  01/24/2012  Job:  [id number]

## 2012-01-24 NOTE — Progress Notes (Signed)
[name] presented for Portacath access and flush. Proper placement of portacath confirmed by CXR. Portacath located right chest wall accessed with  H 20 needle. Good blood return present. Portacath flushed with 20ml NS and 500U/5ml Heparin and needle removed intact. Procedure without incident. Patient tolerated procedure well.   

## 2012-01-24 NOTE — Progress Notes (Signed)
This office note has been dictated.

## 2012-01-24 NOTE — Patient Instructions (Signed)
[name]  [id number] [birth year] Dr. Glenford Peers   [facility]  Discharge Instructions  RECOMMENDATIONS MADE BY THE CONSULTANT AND ANY TEST RESULTS WILL BE SENT TO YOUR REFERRING DOCTOR.   EXAM FINDINGS BY MD TODAY AND SIGNS AND SYMPTOMS TO REPORT TO CLINIC OR PRIMARY MD: exam and discussion per MD.  You are doing well.  We will check labs today and get a PET Scan in 3 months.  MEDICATIONS PRESCRIBED: none   INSTRUCTIONS GIVEN AND DISCUSSED: Other :  Report changes in bowel habits, blood in your bowel movement, etc.  SPECIAL INSTRUCTIONS/FOLLOW-UP: Lab work Needed today and in 12 weeks, Port flush every 6 weeks and to see MD after PET scan.   I acknowledge that I have been informed and understand all the instructions given to me and received a copy. I do not have any more questions at this time, but understand that I may call the Specialty Clinic at [facility] at [phone] during business hours should I have any further questions or need assistance in obtaining follow-up care.    __________________________________________  _____________  __________ Signature of Patient or Authorized Representative            Date                   Time    __________________________________________ Nurse's Signature

## 2012-01-25 LAB — CEA: CEA: 1.7 ng/mL (ref 0.0–5.0)

## 2012-02-11 ENCOUNTER — Encounter (INDEPENDENT_AMBULATORY_CARE_PROVIDER_SITE_OTHER): Payer: Self-pay

## 2012-02-22 ENCOUNTER — Other Ambulatory Visit (HOSPITAL_COMMUNITY): Payer: Medicare Other

## 2012-03-29 ENCOUNTER — Encounter (HOSPITAL_COMMUNITY): Payer: Medicare Other | Attending: Cardiology

## 2012-03-29 DIAGNOSIS — C189 Malignant neoplasm of colon, unspecified: Secondary | ICD-10-CM | POA: Insufficient documentation

## 2012-03-29 DIAGNOSIS — C50919 Malignant neoplasm of unspecified site of unspecified female breast: Secondary | ICD-10-CM

## 2012-03-29 LAB — CBC
HCT: 41.7 % (ref 36.0–46.0)
MCH: 28.6 pg (ref 26.0–34.0)
MCV: 87.1 fL (ref 78.0–100.0)
RBC: 4.79 MIL/uL (ref 3.87–5.11)
RDW: 14.1 % (ref 11.5–15.5)
WBC: 5.8 10*3/uL (ref 4.0–10.5)

## 2012-03-29 LAB — COMPREHENSIVE METABOLIC PANEL
Alkaline Phosphatase: 62 U/L (ref 39–117)
BUN: 16 mg/dL (ref 6–23)
Chloride: 105 mEq/L (ref 96–112)
Creatinine, Ser: 0.5 mg/dL (ref 0.50–1.10)
GFR calc Af Amer: >90 mL/min (ref >90)
GFR calc non Af Amer: >90 mL/min (ref >90)
Glucose, Bld: 93 mg/dL (ref 70–99)
Potassium: 3.9 mEq/L (ref 3.5–5.1)
Total Bilirubin: 2 mg/dL — ABNORMAL HIGH (ref 0.3–1.2)

## 2012-03-29 MED ORDER — SODIUM CHLORIDE 0.9 % IJ SOLN
INTRAMUSCULAR | Status: AC
  Filled 2012-03-29: qty 10

## 2012-03-29 MED ORDER — SODIUM CHLORIDE 0.9 % IJ SOLN
10.0000 mL | INTRAMUSCULAR | Status: DC | PRN
  Administered 2012-03-29: 10 mL via INTRAVENOUS
  Filled 2012-03-29: qty 10

## 2012-03-29 MED ORDER — HEPARIN SOD (PORK) LOCK FLUSH 100 UNIT/ML IV SOLN
500.0000 [IU] | Freq: Once | INTRAVENOUS | Status: AC
  Administered 2012-03-29: 500 [IU] via INTRAVENOUS
  Filled 2012-03-29: qty 5

## 2012-03-29 MED ORDER — HEPARIN SOD (PORK) LOCK FLUSH 100 UNIT/ML IV SOLN
INTRAVENOUS | Status: AC
  Filled 2012-03-29: qty 5

## 2012-03-29 NOTE — Progress Notes (Signed)
[name] presented for Portacath access and flush. Proper placement of portacath confirmed by CXR. Portacath located right chest wall accessed with  H 20 needle. Good blood return present. Portacath flushed with 20ml NS and 500U/5ml Heparin and needle removed intact. Procedure without incident. Patient tolerated procedure well.   

## 2012-04-17 ENCOUNTER — Other Ambulatory Visit (HOSPITAL_COMMUNITY): Payer: Medicare Other

## 2012-04-25 ENCOUNTER — Encounter (HOSPITAL_COMMUNITY): Payer: Self-pay

## 2012-04-25 ENCOUNTER — Encounter (HOSPITAL_COMMUNITY)
Admission: RE | Admit: 2012-04-25 | Discharge: 2012-04-25 | Disposition: A | Discharge status: Home / Self Care | Payer: Medicare Other | Source: Ambulatory Visit | Attending: Oncology | Admitting: Oncology

## 2012-04-25 DIAGNOSIS — K8020 Calculus of gallbladder without cholecystitis without obstruction: Secondary | ICD-10-CM | POA: Insufficient documentation

## 2012-04-25 DIAGNOSIS — I878 Other specified disorders of veins: Secondary | ICD-10-CM

## 2012-04-25 DIAGNOSIS — C189 Malignant neoplasm of colon, unspecified: Secondary | ICD-10-CM

## 2012-04-25 LAB — GLUCOSE, CAPILLARY: Glucose-Capillary: 84 mg/dL (ref 70–99)

## 2012-04-25 MED ORDER — FLUDEOXYGLUCOSE F - 18 (FDG) INJECTION
17.3000 | Freq: Once | INTRAVENOUS | Status: AC | PRN
  Administered 2012-04-25: 17.3 via INTRAVENOUS

## 2012-04-28 ENCOUNTER — Ambulatory Visit (HOSPITAL_COMMUNITY): Payer: Medicare Other | Admitting: Oncology

## 2012-05-10 ENCOUNTER — Encounter (HOSPITAL_COMMUNITY): Payer: Medicare Other

## 2012-05-24 ENCOUNTER — Other Ambulatory Visit (HOSPITAL_COMMUNITY): Payer: Medicare Other

## 2012-05-29 ENCOUNTER — Encounter (HOSPITAL_COMMUNITY): Payer: Medicare Other | Attending: Cardiology

## 2012-05-29 DIAGNOSIS — C779 Secondary and unspecified malignant neoplasm of lymph node, unspecified: Secondary | ICD-10-CM

## 2012-05-29 DIAGNOSIS — Z452 Encounter for adjustment and management of vascular access device: Secondary | ICD-10-CM

## 2012-05-29 DIAGNOSIS — C189 Malignant neoplasm of colon, unspecified: Secondary | ICD-10-CM | POA: Insufficient documentation

## 2012-05-29 LAB — CBC
HCT: 41.8 % (ref 36.0–46.0)
Hemoglobin: 13.8 g/dL (ref 12.0–15.0)
MCH: 28.6 pg (ref 26.0–34.0)
MCHC: 33 g/dL (ref 30.0–36.0)
MCV: 86.7 fL (ref 78.0–100.0)

## 2012-05-29 LAB — COMPREHENSIVE METABOLIC PANEL
Albumin: 3.8 g/dL (ref 3.5–5.2)
BUN: 18 mg/dL (ref 6–23)
Chloride: 103 mEq/L (ref 96–112)
Creatinine, Ser: 0.51 mg/dL (ref 0.50–1.10)
GFR calc non Af Amer: >90 mL/min (ref >90)
Total Bilirubin: 2 mg/dL — ABNORMAL HIGH (ref 0.3–1.2)

## 2012-05-29 LAB — DIFFERENTIAL
Basophils Relative: 1 % (ref 0–1)
Monocytes Absolute: 0.4 10*3/uL (ref 0.1–1.0)
Monocytes Relative: 7 % (ref 3–12)
Neutro Abs: 3.9 10*3/uL (ref 1.7–7.7)

## 2012-05-29 MED ORDER — HEPARIN SOD (PORK) LOCK FLUSH 100 UNIT/ML IV SOLN
INTRAVENOUS | Status: AC
  Filled 2012-05-29: qty 5

## 2012-05-29 MED ORDER — SODIUM CHLORIDE 0.9 % IJ SOLN
INTRAMUSCULAR | Status: AC
  Filled 2012-05-29: qty 20

## 2012-05-29 MED ORDER — HEPARIN SOD (PORK) LOCK FLUSH 100 UNIT/ML IV SOLN
500.0000 [IU] | Freq: Once | INTRAVENOUS | Status: AC
  Administered 2012-05-29: 500 [IU] via INTRAVENOUS
  Filled 2012-05-29: qty 5

## 2012-05-29 MED ORDER — SODIUM CHLORIDE 0.9 % IJ SOLN
20.0000 mL | INTRAMUSCULAR | Status: DC | PRN
  Administered 2012-05-29: 20 mL via INTRAVENOUS
  Filled 2012-05-29: qty 20

## 2012-05-29 NOTE — Progress Notes (Signed)
Orvan Seen presented for Portacath access and flush. Proper placement of portacath confirmed by CXR. Portacath located left chest wall accessed with  H 20 needle. Good blood return present.  Specimen drawn for the lab. Portacath flushed with 20ml NS and 500U/74ml Heparin and needle removed intact. Procedure without incident. Patient tolerated procedure well.

## 2012-05-30 LAB — CEA: CEA: 1.5 ng/mL (ref 0.0–5.0)

## 2012-06-26 ENCOUNTER — Encounter (HOSPITAL_COMMUNITY): Payer: Self-pay | Admitting: Oncology

## 2012-06-26 ENCOUNTER — Encounter (HOSPITAL_COMMUNITY): Payer: Medicare Other | Attending: Cardiology | Admitting: Oncology

## 2012-06-26 VITALS — BP 122/72 | HR 86 | Temp 97.6°F | Resp 16 | Wt 134.1 lb

## 2012-06-26 DIAGNOSIS — C189 Malignant neoplasm of colon, unspecified: Secondary | ICD-10-CM

## 2012-06-26 DIAGNOSIS — I4891 Unspecified atrial fibrillation: Secondary | ICD-10-CM

## 2012-06-26 DIAGNOSIS — C50919 Malignant neoplasm of unspecified site of unspecified female breast: Secondary | ICD-10-CM

## 2012-06-26 NOTE — Progress Notes (Signed)
Diagnoses #1 metastatic recurrent cancer the colon status post 6 cycles of capecitabine, finishing her last dose in September 2012 for recurrence in the pelvis close to the pubic symphysis that was 1.3 cm in size and was PET positive. Her PET scan in September was actually negative. Diagnosis # 2 CABG in November 2012 after small MI Diagnosis #3 hyperlipidemia Diagnosis #4 hypertension Diagnosis #5 history of atrial fibrillation Her PET scan was fine in September. Laboratory work was excellent. Oncologic review of systems today is noncontributory. She feels great and is fully functional. Vital signs are stable. Port-A-Cath in the right upper chest walls intact. Breast exam is negative for masses. Lungs are clear. Heart shows a regular rhythm and rate without murmur rub or gallop. Abdomen is soft nontender without hepatosplenomegaly. Bowel sounds are normal. She has no distention or ascites. She has no leg edema or arm edema. She is alert and oriented. She looks great. We will see her back in 6 months after repeat PET scan in some blood work.

## 2012-06-26 NOTE — Patient Instructions (Addendum)
[facility]  Discharge Instructions  RECOMMENDATIONS MADE BY THE CONSULTANT AND ANY TEST RESULTS WILL BE SENT TO YOUR REFERRING DOCTOR.   EXAM FINDINGS BY MD TODAY AND SIGNS AND SYMPTOMS TO REPORT TO CLINIC OR PRIMARY MD: exam and discussion by MD.  Bonita Quin are doing well.  MD recommends that you continue with mammograms.  We will recheck blood work and PET scan in 6 months.  MEDICATIONS PRESCRIBED: none   INSTRUCTIONS GIVEN AND DISCUSSED: Other :  Report any changes in bowel habits, blood in your stool, etc.  SPECIAL INSTRUCTIONS/FOLLOW-UP: Lab work Needed in March, Xray Studies Needed PET in March and Return to Clinic to see MD after labs and PET scan in March.   I acknowledge that I have been informed and understand all the instructions given to me and received a copy. I do not have any more questions at this time, but understand that I may call the Specialty Clinic at [facility] at [phone] during business hours should I have any further questions or need assistance in obtaining follow-up care.    __________________________________________  _____________  __________ Signature of Patient or Authorized Representative            Date                   Time    __________________________________________ Nurse's Signature

## 2012-07-10 ENCOUNTER — Encounter (HOSPITAL_COMMUNITY): Payer: Medicare Other | Attending: Cardiology

## 2012-07-10 DIAGNOSIS — C189 Malignant neoplasm of colon, unspecified: Secondary | ICD-10-CM | POA: Insufficient documentation

## 2012-07-10 DIAGNOSIS — C569 Malignant neoplasm of unspecified ovary: Secondary | ICD-10-CM | POA: Insufficient documentation

## 2012-07-10 DIAGNOSIS — Z452 Encounter for adjustment and management of vascular access device: Secondary | ICD-10-CM

## 2012-07-10 MED ORDER — HEPARIN SOD (PORK) LOCK FLUSH 100 UNIT/ML IV SOLN
INTRAVENOUS | Status: AC
  Filled 2012-07-10: qty 5

## 2012-07-10 MED ORDER — SODIUM CHLORIDE 0.9 % IJ SOLN
20.0000 mL | INTRAMUSCULAR | Status: DC | PRN
  Administered 2012-07-10: 20 mL via INTRAVENOUS
  Filled 2012-07-10: qty 20

## 2012-07-10 MED ORDER — SODIUM CHLORIDE 0.9 % IJ SOLN
INTRAMUSCULAR | Status: AC
  Filled 2012-07-10: qty 20

## 2012-07-10 MED ORDER — HEPARIN SOD (PORK) LOCK FLUSH 100 UNIT/ML IV SOLN
500.0000 [IU] | Freq: Once | INTRAVENOUS | Status: AC
  Administered 2012-07-10: 500 [IU] via INTRAVENOUS
  Filled 2012-07-10: qty 5

## 2012-07-10 NOTE — Progress Notes (Signed)
[name] presented for Portacath access and flush. Proper placement of portacath confirmed by CXR. Portacath located right chest wall accessed with  H 20 needle. Good blood return present. Portacath flushed with 20ml NS and 500U/5ml Heparin and needle removed intact. Procedure without incident. Patient tolerated procedure well.   

## 2012-08-21 ENCOUNTER — Encounter (HOSPITAL_COMMUNITY): Payer: Medicare Other | Attending: Cardiology

## 2012-08-21 DIAGNOSIS — Z452 Encounter for adjustment and management of vascular access device: Secondary | ICD-10-CM

## 2012-08-21 DIAGNOSIS — Z9889 Other specified postprocedural states: Secondary | ICD-10-CM | POA: Insufficient documentation

## 2012-08-21 DIAGNOSIS — Z95828 Presence of other vascular implants and grafts: Secondary | ICD-10-CM

## 2012-08-21 DIAGNOSIS — C50919 Malignant neoplasm of unspecified site of unspecified female breast: Secondary | ICD-10-CM

## 2012-08-21 DIAGNOSIS — C189 Malignant neoplasm of colon, unspecified: Secondary | ICD-10-CM

## 2012-08-21 MED ORDER — HEPARIN SOD (PORK) LOCK FLUSH 100 UNIT/ML IV SOLN
INTRAVENOUS | Status: AC
  Filled 2012-08-21: qty 5

## 2012-08-21 MED ORDER — HEPARIN SOD (PORK) LOCK FLUSH 100 UNIT/ML IV SOLN
500.0000 [IU] | Freq: Once | INTRAVENOUS | Status: AC
  Administered 2012-08-21: 500 [IU] via INTRAVENOUS
  Filled 2012-08-21: qty 5

## 2012-08-21 MED ORDER — SODIUM CHLORIDE 0.9 % IJ SOLN
10.0000 mL | INTRAMUSCULAR | Status: DC | PRN
  Administered 2012-08-21: 10 mL via INTRAVENOUS
  Filled 2012-08-21: qty 10

## 2012-08-21 NOTE — Progress Notes (Signed)
[name] presented for Portacath access and flush. Proper placement of portacath confirmed by CXR. Portacath located rt chest wall accessed with  H 20 needle. Good blood return present. Portacath flushed with 20ml NS and 500U/4ml Heparin and needle removed intact. Procedure without incident. Patient tolerated procedure well.

## 2012-10-02 ENCOUNTER — Encounter (HOSPITAL_COMMUNITY): Payer: Medicare Other | Attending: Cardiology

## 2012-10-02 DIAGNOSIS — Z95828 Presence of other vascular implants and grafts: Secondary | ICD-10-CM

## 2012-10-02 DIAGNOSIS — Z9889 Other specified postprocedural states: Secondary | ICD-10-CM | POA: Insufficient documentation

## 2012-10-02 DIAGNOSIS — Z452 Encounter for adjustment and management of vascular access device: Secondary | ICD-10-CM

## 2012-10-02 DIAGNOSIS — C189 Malignant neoplasm of colon, unspecified: Secondary | ICD-10-CM

## 2012-10-02 MED ORDER — SODIUM CHLORIDE 0.9 % IJ SOLN
10.0000 mL | INTRAMUSCULAR | Status: DC | PRN
  Administered 2012-10-02: 10 mL via INTRAVENOUS
  Filled 2012-10-02: qty 10

## 2012-10-02 MED ORDER — HEPARIN SOD (PORK) LOCK FLUSH 100 UNIT/ML IV SOLN
INTRAVENOUS | Status: AC
  Filled 2012-10-02: qty 5

## 2012-10-02 MED ORDER — HEPARIN SOD (PORK) LOCK FLUSH 100 UNIT/ML IV SOLN
500.0000 [IU] | Freq: Once | INTRAVENOUS | Status: AC
  Administered 2012-10-02: 500 [IU] via INTRAVENOUS
  Filled 2012-10-02: qty 5

## 2012-10-02 NOTE — Progress Notes (Signed)
[name] presented for Portacath access and flush. Proper placement of portacath confirmed by CXR. Portacath located rt chest wall accessed with  H 20 needle. Good blood return present. Portacath flushed with 20ml NS and 500U/5ml Heparin and needle removed intact. Procedure without incident. Patient tolerated procedure well.   

## 2012-11-17 ENCOUNTER — Other Ambulatory Visit (HOSPITAL_COMMUNITY): Payer: Medicare Other

## 2012-11-20 ENCOUNTER — Encounter (HOSPITAL_COMMUNITY): Payer: Medicare Other

## 2012-11-21 ENCOUNTER — Other Ambulatory Visit (HOSPITAL_COMMUNITY): Payer: Medicare Other

## 2012-11-21 ENCOUNTER — Ambulatory Visit (HOSPITAL_COMMUNITY): Payer: Medicare Other | Admitting: Oncology

## 2012-11-22 ENCOUNTER — Encounter (HOSPITAL_COMMUNITY): Payer: Self-pay

## 2012-11-22 ENCOUNTER — Encounter (HOSPITAL_COMMUNITY)
Admission: RE | Admit: 2012-11-22 | Discharge: 2012-11-22 | Disposition: A | Discharge status: Home / Self Care | Payer: Medicare Other | Source: Ambulatory Visit | Attending: Oncology | Admitting: Oncology

## 2012-11-22 DIAGNOSIS — K8020 Calculus of gallbladder without cholecystitis without obstruction: Secondary | ICD-10-CM | POA: Insufficient documentation

## 2012-11-22 DIAGNOSIS — Z9049 Acquired absence of other specified parts of digestive tract: Secondary | ICD-10-CM | POA: Insufficient documentation

## 2012-11-22 DIAGNOSIS — I700 Atherosclerosis of aorta: Secondary | ICD-10-CM | POA: Insufficient documentation

## 2012-11-22 DIAGNOSIS — C189 Malignant neoplasm of colon, unspecified: Secondary | ICD-10-CM

## 2012-11-22 LAB — GLUCOSE, CAPILLARY: Glucose-Capillary: 95 mg/dL (ref 70–99)

## 2012-11-22 MED ORDER — FLUDEOXYGLUCOSE F - 18 (FDG) INJECTION
18.0000 | Freq: Once | INTRAVENOUS | Status: AC | PRN
  Administered 2012-11-22: 18 via INTRAVENOUS

## 2012-12-04 ENCOUNTER — Encounter (HOSPITAL_COMMUNITY): Payer: Medicare Other | Attending: Cardiology | Admitting: Oncology

## 2012-12-04 ENCOUNTER — Encounter (HOSPITAL_COMMUNITY): Payer: Self-pay | Admitting: Oncology

## 2012-12-04 VITALS — BP 123/86 | HR 84 | Temp 97.4°F | Resp 16 | Wt 127.4 lb

## 2012-12-04 DIAGNOSIS — I10 Essential (primary) hypertension: Secondary | ICD-10-CM

## 2012-12-04 DIAGNOSIS — C189 Malignant neoplasm of colon, unspecified: Secondary | ICD-10-CM

## 2012-12-04 DIAGNOSIS — I4891 Unspecified atrial fibrillation: Secondary | ICD-10-CM

## 2012-12-04 NOTE — Progress Notes (Signed)
[name] Seen presented for labwork. Labs per MD order drawn via Peripheral Line 23 gauge needle inserted in right AC  Good blood return present. Procedure without incident.  Needle removed intact. Patient tolerated procedure well.

## 2012-12-04 NOTE — Patient Instructions (Signed)
[facility] Discharge Instructions  RECOMMENDATIONS MADE BY THE CONSULTANT AND ANY TEST RESULTS WILL BE SENT TO YOUR REFERRING PHYSICIAN.  EXAM FINDINGS BY THE PHYSICIAN TODAY AND SIGNS OR SYMPTOMS TO REPORT TO CLINIC OR PRIMARY PHYSICIAN: Exam and discussion by MD.  Bonita Quin are doing well.  Report changes in bowel habits, blood in your stool or other problems.  MEDICATIONS PRESCRIBED:  none    SPECIAL INSTRUCTIONS/FOLLOW-UP: Port flush every 6 weeks, blood work in 3 months and to be seen in follow-up after labs.  Thank you for choosing [facility] to provide your oncology and hematology care.  To afford each patient quality time with our providers, please arrive at least 15 minutes before your scheduled appointment time.  With your help, our goal is to use those 15 minutes to complete the necessary work-up to ensure our physicians have the information they need to help with your evaluation and healthcare recommendations.    Effective January 1st, 2014, we ask that you re-schedule your appointment with our physicians should you arrive 10 or more minutes late for your appointment.  We strive to give you quality time with our providers, and arriving late affects you and other patients whose appointments are after yours.    Again, thank you for choosing [facility].  Our hope is that these requests will decrease the amount of time that you wait before being seen by our physicians.       _____________________________________________________________  Should you have questions after your visit to [facility], please contact our office at [phone] between the hours of 8:30 a.m. and 5:00 p.m.  Voicemails left after 4:30 p.m. will not be returned until the following business day.  For prescription refill requests, have your pharmacy contact our office with your prescription refill request.

## 2012-12-05 LAB — CEA: CEA: 2.2 ng/mL (ref 0.0–5.0)

## 2012-12-05 NOTE — Progress Notes (Signed)
Diagnosis number 1 metastatic recurrent cancer of the colon status post 6 cycles of capecitabine, finishing her last dose in September 2012 for recurrence in the pelvis close to the symphysis pubis. The size was 1.3 cm, PET positive. Once again her PET scan just  the other day is absolutely negative. Her CEA also remains within the normal range. She is asymptomatic.  #2 CABG in November 2012 after small myocardial infarction #3 hyperlipidemia #4 hypertension #5 history of atrial fibrillation   Once again her vital signs are stable and her oncology review of systems is negative. Her CEA and a PET scan are well within the normal range with no evidence for recurrent disease.  I did not examine her further. We'll see her back in 3 months examiner then continued check her CEA levels and we'll probably do a PET scan in either 6 months or 9 months but I suspect we will need to repeat in 6 months. That has been the best way the recurrent disease has been detected and this lady.

## 2012-12-11 ENCOUNTER — Telehealth: Payer: Self-pay | Admitting: Cardiology

## 2012-12-11 MED ORDER — ATORVASTATIN CALCIUM 20 MG PO TABS: 20.0000 mg | ORAL_TABLET | Freq: Every day | ORAL | Status: DC

## 2012-12-11 NOTE — Telephone Encounter (Signed)
PT NEEDS LIPITOR CALLED IN TO [facility]

## 2012-12-29 ENCOUNTER — Ambulatory Visit (INDEPENDENT_AMBULATORY_CARE_PROVIDER_SITE_OTHER): Payer: Medicare Other | Admitting: Cardiology

## 2012-12-29 ENCOUNTER — Encounter: Payer: Self-pay | Admitting: Cardiology

## 2012-12-29 VITALS — BP 114/72 | HR 80 | Ht 65.0 in | Wt 130.0 lb

## 2012-12-29 DIAGNOSIS — I7090 Unspecified atherosclerosis: Secondary | ICD-10-CM

## 2012-12-29 DIAGNOSIS — M069 Rheumatoid arthritis, unspecified: Secondary | ICD-10-CM

## 2012-12-29 DIAGNOSIS — C569 Malignant neoplasm of unspecified ovary: Secondary | ICD-10-CM

## 2012-12-29 DIAGNOSIS — I2510 Atherosclerotic heart disease of native coronary artery without angina pectoris: Secondary | ICD-10-CM

## 2012-12-29 DIAGNOSIS — E785 Hyperlipidemia, unspecified: Secondary | ICD-10-CM

## 2012-12-29 DIAGNOSIS — I10 Essential (primary) hypertension: Secondary | ICD-10-CM

## 2012-12-29 MED ORDER — ATORVASTATIN CALCIUM 20 MG PO TABS: 20.0000 mg | ORAL_TABLET | Freq: Every day | ORAL | Status: DC

## 2012-12-29 NOTE — Assessment & Plan Note (Signed)
In remission.

## 2012-12-29 NOTE — Assessment & Plan Note (Signed)
Optimal control of hyperlipidemia with current therapy.

## 2012-12-29 NOTE — Assessment & Plan Note (Signed)
Blood pressure is excellent despite the absence of pharmacologic therapy. Hypertension may date to a time when her weight was higher or may represent an erroneous diagnosis.

## 2012-12-29 NOTE — Progress Notes (Deleted)
Name: [name]    DOB: 1933-06-15  Age: 79 y.o.  MR#: [id number]       PCP:  Lilyan Punt, MD      Insurance: Payor: MEDICARE  Plan: MEDICARE PART A AND B  Product Type: *No Product type*    CC:   No chief complaint on file. medication bottles   VS Filed Vitals:   12/29/12 1320  BP: 114/72  Pulse: 80  Height: 5\' 5"  (1.651 m)  Weight: 130 lb 0.6 oz (58.986 kg)    Weights Current Weight  12/29/12 130 lb 0.6 oz (58.986 kg)  12/04/12 127 lb 6.4 oz (57.788 kg)  06/26/12 134 lb 1.6 oz (60.827 kg)    Blood Pressure  BP Readings from Last 3 Encounters:  12/29/12 114/72  12/04/12 123/86  06/26/12 122/72     Admit date:  (Not on file) Last encounter with RMR:  12/11/2012   Allergy Sulfa antibiotics  Current Outpatient Prescriptions  Medication Sig Dispense Refill  . aspirin 81 MG tablet Take 81 mg by mouth daily.      Marland Kitchen atorvastatin (LIPITOR) 20 MG tablet Take 1 tablet (20 mg total) by mouth daily.  30 tablet  6  . fish oil-omega-3 fatty acids 1000 MG capsule Take 1 g by mouth 2 (two) times daily.       No current facility-administered medications for this visit.    Discontinued Meds:   There are no discontinued medications.  Patient Active Problem List  Diagnosis  . Recurrent adenocarcinoma of colon  . Hypertension  . Ovarian cancer  . History of polymyalgia rheumatica  . Rheumatoid arthritis  . Fibromyalgia  . Arteriosclerotic cardiovascular disease (ASCVD)  . Atrial fibrillation  . Hyperlipidemia  . Asthmatic bronchitis    LABS    Component Value Date/Time   NA 139 05/29/2012 1051   NA 140 03/29/2012 1120   NA 139 01/24/2012 1330   K 3.7 05/29/2012 1051   K 3.9 03/29/2012 1120   K 3.7 01/24/2012 1330   CL 103 05/29/2012 1051   CL 105 03/29/2012 1120   CL 104 01/24/2012 1330   CO2 28 05/29/2012 1051   CO2 27 03/29/2012 1120   CO2 26 01/24/2012 1330   GLUCOSE 98 05/29/2012 1051   GLUCOSE 93 03/29/2012 1120   GLUCOSE 81 01/24/2012 1330   BUN 18 05/29/2012 1051    BUN 16 03/29/2012 1120   BUN 12 01/24/2012 1330   CREATININE 0.51 05/29/2012 1051   CREATININE 0.50 03/29/2012 1120   CREATININE 0.50 01/24/2012 1330   CREATININE 0.63 11/04/2011 1024   CALCIUM 9.2 05/29/2012 1051   CALCIUM 9.4 03/29/2012 1120   CALCIUM 9.5 01/24/2012 1330   GFRNONAA >90 05/29/2012 1051   GFRNONAA >90 03/29/2012 1120   GFRNONAA >90 01/24/2012 1330   GFRAA >90 05/29/2012 1051   GFRAA >90 03/29/2012 1120   GFRAA >90 01/24/2012 1330   CMP     Component Value Date/Time   NA 139 05/29/2012 1051   K 3.7 05/29/2012 1051   CL 103 05/29/2012 1051   CO2 28 05/29/2012 1051   GLUCOSE 98 05/29/2012 1051   BUN 18 05/29/2012 1051   CREATININE 0.51 05/29/2012 1051   CREATININE 0.63 11/04/2011 1024   CALCIUM 9.2 05/29/2012 1051   PROT 6.6 05/29/2012 1051   ALBUMIN 3.8 05/29/2012 1051   AST 15 05/29/2012 1051   ALT 11 05/29/2012 1051   ALKPHOS 63 05/29/2012 1051   BILITOT 2.0* 05/29/2012 1051   GFRNONAA >  90 05/29/2012 1051   GFRAA >90 05/29/2012 1051       Component Value Date/Time   WBC 5.8 05/29/2012 1051   WBC 5.8 03/29/2012 1120   WBC 6.4 01/24/2012 1330   HGB 13.8 05/29/2012 1051   HGB 13.7 03/29/2012 1120   HGB 13.6 01/24/2012 1330   HCT 41.8 05/29/2012 1051   HCT 41.7 03/29/2012 1120   HCT 42.2 01/24/2012 1330   MCV 86.7 05/29/2012 1051   MCV 87.1 03/29/2012 1120   MCV 84.9 01/24/2012 1330    Lipid Panel     Component Value Date/Time   CHOL 151 11/04/2011 1024   TRIG 166* 11/04/2011 1024   HDL 56 11/04/2011 1024   CHOLHDL 2.7 11/04/2011 1024   VLDL 33 11/04/2011 1024   LDLCALC 62 11/04/2011 1024    ABG    Component Value Date/Time   PHART 7.386 07/12/2011 2003   PCO2ART 36.6 07/12/2011 2003   PO2ART 87.0 07/12/2011 2003   HCO3 21.7 07/12/2011 2003   TCO2 23 07/13/2011 1657   ACIDBASEDEF 3.0* 07/12/2011 2003   O2SAT 96.0 07/12/2011 2003     Lab Results  Component Value Date   TSH 2.691 07/15/2011   BNP (last 3 results) No results found for this basename: PROBNP,  in the last 8760  hours Cardiac Panel (last 3 results) No results found for this basename: CKTOTAL, CKMB, TROPONINI, RELINDX,  in the last 72 hours  Iron/TIBC/Ferritin No results found for this basename: iron, tibc, ferritin     EKG Orders placed in visit on 08/04/11  . EKG 12-LEAD     Prior Assessment and Plan Problem List as of 12/29/2012     ICD-9-CM     Cardiology Problems   Hypertension   Last Assessment & Plan   09/02/2011 Office Visit Written 09/02/2011  4:47 PM by Kathlen Brunswick, MD     Blood pressure control has been marginal.  Patient will monitor over the next 4 weeks and return a list of blood pressures to [place] to determine if she needs additional antihypertensive medication.  Metoprolol is being provided at minimal doses and will be discontinued.  Addition of an ACE inhibitor will likely be appropriate.    Arteriosclerotic cardiovascular disease (ASCVD)   Last Assessment & Plan   12/20/2011 Office Visit Edited 12/20/2011  2:20 PM by Kathlen Brunswick, MD     Patient is doing superbly, having almost completed the cardiac rehabilitation program.  She is asymptomatic from a cardiac standpoint.  Her only risk factor that can be addressed is hyperlipidemia, and control of that is excellent.  Fish oil will be added to her medical regime.  Patient is confused with respect to the dose of aspirin that she should be taking.  162 mg per day will be prescribed for the next 6 months after which 81 mg per day will be adequate.    Atrial fibrillation   Last Assessment & Plan   09/02/2011 Office Visit Edited 09/02/2011  4:49 PM by Kathlen Brunswick, MD     No apparent recurrence of arrhythmia since hospital discharge.  Amiodarone will be discontinued.  Levothyroxine was added to patient's medical therapy during her SNF stay.  She stopped treatment upon discharge, and a recent TSH level was normal.  She does not appear to have hypothyroidism.    Hyperlipidemia   Last Assessment & Plan   09/02/2011 Office  Visit Written 09/02/2011  4:46 PM by Kathlen Brunswick, MD  No recent lipid profile available.  Simvastatin will be increased to 40 mg per day and lipids reassessed in one month.      Other   Recurrent adenocarcinoma of colon   Ovarian cancer   Last Assessment & Plan   12/20/2011 Office Visit Edited 01/01/2012  9:29 AM by Kathlen Brunswick, MD     No evidence of disease after multiple surgeries, substantial chemotherapy and multiple recurrences.    History of polymyalgia rheumatica   Rheumatoid arthritis   Fibromyalgia   Asthmatic bronchitis       Imaging: No results found.

## 2012-12-29 NOTE — Patient Instructions (Addendum)
Your physician recommends that you continue on your current medications as directed. Please refer to the Current Medication list given to you today.  Your physician wants you to follow-up in: 1 year. You will receive a reminder letter in the mail two months in advance. If you don't receive a letter, please call our office to schedule the follow-up appointment.  

## 2012-12-29 NOTE — Assessment & Plan Note (Signed)
Ms. Quiros has done superbly following uncomplicated CABG 18 months ago. Risk factors are optimally controlled. Annual cardiology visits are appropriate from here forward.

## 2012-12-29 NOTE — Progress Notes (Signed)
Patient ID: [name], female   DOB: 1932/12/20, 79 y.o.   MRN: [id number]  HPI: Schedule return visit for this remarkable woman, who is thriving despite a history of severe coronary disease, rheumatoid arthritis, adenocarcinoma of the colon and ovarian carcinoma. She reports in no activity of arthritis with no joint deformities despite having stopped treatment sometime ago. She underwent a PET-CT scan 2 months ago that was reportedly negative. She remains active including gardening and volunteering for the hospital.  Current Outpatient Prescriptions  Medication Sig Dispense Refill  . aspirin 81 MG tablet Take 81 mg by mouth daily.      Marland Kitchen atorvastatin (LIPITOR) 20 MG tablet Take 1 tablet (20 mg total) by mouth daily.  30 tablet  6  . fish oil-omega-3 fatty acids 1000 MG capsule Take 1 g by mouth 2 (two) times daily.       No current facility-administered medications for this visit.   Allergies  Allergen Reactions  . Sulfa Antibiotics      Past medical history, social history, and family history reviewed and updated.  ROS: Denies chest pain, dyspnea, pedal edema, palpitations, lightheadedness or syncope. All other systems reviewed and are negative.  PHYSICAL EXAM: BP 114/72  Pulse 80  Ht 5\' 5"  (1.651 m)  Wt 58.986 kg (130 lb 0.6 oz)  BMI 21.64 kg/m2;  Body mass index is 21.64 kg/(m^2). General-Well developed; no acute distress Body habitus-mildly underweight, but this has been long-standing Neck-No JVD; no carotid bruits Lungs-clear lung fields; resonant to percussion Cardiovascular-normal PMI; normal S1 and S2 Abdomen-normal bowel sounds; soft and non-tender without masses or organomegaly Musculoskeletal-No deformities, no cyanosis or clubbing Neurologic-Normal cranial nerves; symmetric strength and tone Skin-Warm, no significant lesions Extremities-distal pulses intact; no edema  [place] Bing, MD 12/29/2012  1:37 PM  ASSESSMENT AND PLAN

## 2013-01-10 ENCOUNTER — Other Ambulatory Visit (HOSPITAL_COMMUNITY): Payer: Medicare Other

## 2013-01-30 ENCOUNTER — Telehealth: Payer: Self-pay | Admitting: Cardiology

## 2013-01-30 NOTE — Telephone Encounter (Signed)
Patient would like remainder of her refills on Amlodopine send to [facility].  / tgs

## 2013-01-31 MED ORDER — ATORVASTATIN CALCIUM 20 MG PO TABS: 20.0000 mg | ORAL_TABLET | Freq: Every day | ORAL | Status: AC

## 2013-01-31 NOTE — Telephone Encounter (Signed)
Pt clarified that the medication she needs is Atorvastatin, not amlodipine, sent refills to [facility] via escribe per pt changed pharmacy

## 2013-02-20 IMAGING — CT NM PET TUM IMG RESTAG (PS) SKULL BASE T - THIGH
1 of 6 series · 1 of 25 positions shown · IV contrast ([ID])
Comparison: 10/18/2011

CLINICAL DATA: Subsequent treatment strategy for metastatic colon
carcinoma.

NUCLEAR MEDICINE PET SKULL BASE TO THIGH
Fasting Blood Glucose:  84
TECHNIQUE: 78.3 mCi F-18 FDG was injected intravenously. CT data
was obtained and used for attenuation correction and anatomic
localization only.  (This was not acquired as a diagnostic CT
examination.) Additional exam technical data entered on
technologist worksheet.

[Series 2: ct images · axial · 3.8mm · 0.98mm/px · 1 of 267 slices shown]
[im 267/267  brain]
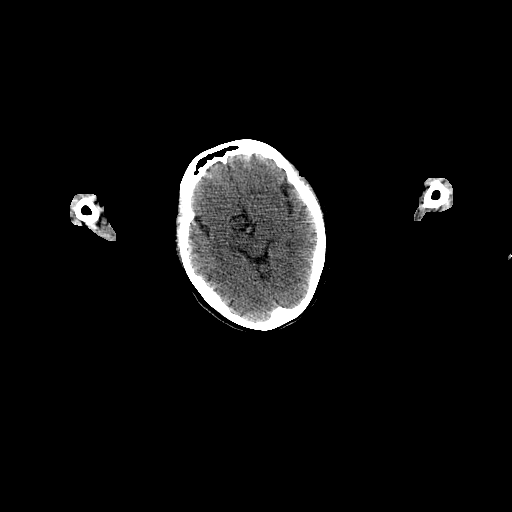

[1 of 25 positions shown; findings below may reference images not displayed]

FINDINGS: Neck: No hypermetabolic lymph nodes in the neck.

Chest:  No hypermetabolic mediastinal or hilar nodes.  No
suspicious pulmonary nodules on the CT scan.

Abdomen/Pelvis:  No abnormal hypermetabolic activity within the
liver, pancreas, adrenal glands, or spleen.  No hypermetabolic
lymph nodes in the abdomen or pelvis. Cholelithiasis again noted.

[place]:  No focal hypermetabolic activity to suggest skeletal
metastasis. Decreased activity is seen associated with
hypermetabolic focus in the anterior left thigh subcutaneous
tissues since previous study, which remains consistent with fat
necrosis.
IMPRESSION: No evidence of recurrent carcinoma or metastatic disease.

## 2013-02-21 ENCOUNTER — Encounter (HOSPITAL_COMMUNITY): Payer: Medicare Other | Attending: Oncology

## 2013-02-21 DIAGNOSIS — E785 Hyperlipidemia, unspecified: Secondary | ICD-10-CM | POA: Insufficient documentation

## 2013-02-21 DIAGNOSIS — Z09 Encounter for follow-up examination after completed treatment for conditions other than malignant neoplasm: Secondary | ICD-10-CM | POA: Insufficient documentation

## 2013-02-21 DIAGNOSIS — C189 Malignant neoplasm of colon, unspecified: Secondary | ICD-10-CM

## 2013-02-21 DIAGNOSIS — I10 Essential (primary) hypertension: Secondary | ICD-10-CM | POA: Insufficient documentation

## 2013-02-21 DIAGNOSIS — Z85038 Personal history of other malignant neoplasm of large intestine: Secondary | ICD-10-CM | POA: Insufficient documentation

## 2013-02-21 DIAGNOSIS — I4891 Unspecified atrial fibrillation: Secondary | ICD-10-CM | POA: Insufficient documentation

## 2013-02-21 LAB — COMPREHENSIVE METABOLIC PANEL
ALT: 12 U/L (ref 0–35)
AST: 17 U/L (ref 0–37)
Alkaline Phosphatase: 70 U/L (ref 39–117)
CO2: 28 mEq/L (ref 19–32)
Chloride: 103 mEq/L (ref 96–112)
GFR calc Af Amer: >90 mL/min (ref >90)
GFR calc non Af Amer: 88 mL/min — ABNORMAL LOW (ref >90)
Glucose, Bld: 89 mg/dL (ref 70–99)
Potassium: 4 mEq/L (ref 3.5–5.1)
Sodium: 141 mEq/L (ref 135–145)
Total Bilirubin: 1.8 mg/dL — ABNORMAL HIGH (ref 0.3–1.2)

## 2013-02-21 LAB — CBC WITH DIFFERENTIAL/PLATELET
Basophils Absolute: 0 10*3/uL (ref 0.0–0.1)
Eosinophils Relative: 2 % (ref 0–5)
Lymphocytes Relative: 24 % (ref 12–46)
Lymphs Abs: 1.7 10*3/uL (ref 0.7–4.0)
MCV: 86.7 fL (ref 78.0–100.0)
Neutro Abs: 4.7 10*3/uL (ref 1.7–7.7)
Neutrophils Relative %: 67 % (ref 43–77)
Platelets: 186 10*3/uL (ref 150–400)
RBC: 5.04 MIL/uL (ref 3.87–5.11)
RDW: 14.5 % (ref 11.5–15.5)
WBC: 7 10*3/uL (ref 4.0–10.5)

## 2013-02-21 MED ORDER — HEPARIN SOD (PORK) LOCK FLUSH 100 UNIT/ML IV SOLN
INTRAVENOUS | Status: AC
  Filled 2013-02-21: qty 5

## 2013-02-21 MED ORDER — HEPARIN SOD (PORK) LOCK FLUSH 100 UNIT/ML IV SOLN
500.0000 [IU] | Freq: Once | INTRAVENOUS | Status: AC
  Administered 2013-02-21: 500 [IU] via INTRAVENOUS
  Filled 2013-02-21: qty 5

## 2013-02-21 MED ORDER — SODIUM CHLORIDE 0.9 % IJ SOLN
10.0000 mL | INTRAMUSCULAR | Status: DC | PRN
  Administered 2013-02-21: 10 mL via INTRAVENOUS
  Filled 2013-02-21: qty 10

## 2013-02-21 NOTE — Progress Notes (Signed)
[name] presented for Portacath access and flush. Proper placement of portacath confirmed by CXR. Portacath located  chest wall accessed with  H 20 needle. Good blood return present. Portacath flushed with 20ml NS and 500U/36ml Heparin and needle removed intact. Procedure without incident. Patient tolerated procedure well.

## 2013-02-28 ENCOUNTER — Encounter (HOSPITAL_COMMUNITY): Payer: Self-pay | Admitting: Oncology

## 2013-02-28 ENCOUNTER — Encounter (HOSPITAL_BASED_OUTPATIENT_CLINIC_OR_DEPARTMENT_OTHER): Payer: Medicare Other | Admitting: Oncology

## 2013-02-28 VITALS — BP 156/84 | HR 74 | Temp 97.3°F | Resp 18 | Wt 128.9 lb

## 2013-02-28 DIAGNOSIS — C189 Malignant neoplasm of colon, unspecified: Secondary | ICD-10-CM

## 2013-02-28 DIAGNOSIS — Z85038 Personal history of other malignant neoplasm of large intestine: Secondary | ICD-10-CM

## 2013-02-28 NOTE — Progress Notes (Signed)
#  1 metastatic recurrent cancer the colon status post 6 cycles of capecitabine with her last dose in September 2012 for recurrence in the pelvis, close to the symphysis pubis. Thus far she has not had a recurrence since stopping the capecitabine. The PET scan was positive and the size of the lesion was 1.3 cm. Her laboratory work shows her CEA remains within the normal range. Her liver enzymes also remain within the normal range. She is asymptomatic on oncology review of systems today.  #2 CABG in November 2012 after small MI by Dr. Edwina Barth #3 hyperlipidemia #4 hypertension #5 history of atrial fibrillation  She really does look good and feels fine. Once again her oncology review of systems is negative. Vital signs are stable. Pulse is irregular today. Rate well controlled. Lungs are clear. She has no S3 gallop or murmur. Lymph node exam shows no palpable adenopathy in the cervical, supraclavicular, infraclavicular, axillary or inguinal areas. Abdomen soft nondistended nontender without ascites without masses without hepatosplenomegaly. She has no subcutaneous skin nodules. She has no leg edema and no arm edema. She is alert and oriented. Skin exam is unremarkable.  We will schedule a PET scan in September and lab work and I will see her then.

## 2013-02-28 NOTE — Patient Instructions (Addendum)
.  [facility] Discharge Instructions  RECOMMENDATIONS MADE BY THE CONSULTANT AND ANY TEST RESULTS WILL BE SENT TO YOUR REFERRING PHYSICIAN.  EXAM FINDINGS BY THE PHYSICIAN TODAY AND SIGNS OR SYMPTOMS TO REPORT TO CLINIC OR PRIMARY PHYSICIAN: Exam is good Let us know if you have any change in bowel habits or see any bloody or dark stools  INSTRUCTIONS GIVEN AND DISCUSSED: Scan and labs in 3 months   Thank you for choosing [facility] to provide your oncology and hematology care.  To afford each patient quality time with our providers, please arrive at least 15 minutes before your scheduled appointment time.  With your help, our goal is to use those 15 minutes to complete the necessary work-up to ensure our physicians have the information they need to help with your evaluation and healthcare recommendations.    Effective January 1st, 2014, we ask that you re-schedule your appointment with our physicians should you arrive 10 or more minutes late for your appointment.  We strive to give you quality time with our providers, and arriving late affects you and other patients whose appointments are after yours.    Again, thank you for choosing [facility].  Our hope is that these requests will decrease the amount of time that you wait before being seen by our physicians.       _____________________________________________________________  Should you have questions after your visit to [facility], please contact our office at [phone] between the hours of 8:30 a.m. and 5:00 p.m.  Voicemails left after 4:30 p.m. will not be returned until the following business day.  For prescription refill requests, have your pharmacy contact our office with your prescription refill request.

## 2013-04-06 ENCOUNTER — Encounter (HOSPITAL_COMMUNITY): Payer: Medicare Other | Attending: Oncology

## 2013-04-06 ENCOUNTER — Encounter (HOSPITAL_COMMUNITY): Payer: Medicare Other

## 2013-04-06 DIAGNOSIS — E785 Hyperlipidemia, unspecified: Secondary | ICD-10-CM | POA: Insufficient documentation

## 2013-04-06 DIAGNOSIS — I10 Essential (primary) hypertension: Secondary | ICD-10-CM | POA: Insufficient documentation

## 2013-04-06 DIAGNOSIS — C189 Malignant neoplasm of colon, unspecified: Secondary | ICD-10-CM

## 2013-04-06 DIAGNOSIS — Z85038 Personal history of other malignant neoplasm of large intestine: Secondary | ICD-10-CM | POA: Insufficient documentation

## 2013-04-06 DIAGNOSIS — I4891 Unspecified atrial fibrillation: Secondary | ICD-10-CM | POA: Insufficient documentation

## 2013-04-06 DIAGNOSIS — Z452 Encounter for adjustment and management of vascular access device: Secondary | ICD-10-CM

## 2013-04-06 DIAGNOSIS — Z09 Encounter for follow-up examination after completed treatment for conditions other than malignant neoplasm: Secondary | ICD-10-CM | POA: Insufficient documentation

## 2013-04-06 MED ORDER — HEPARIN SOD (PORK) LOCK FLUSH 100 UNIT/ML IV SOLN
500.0000 [IU] | Freq: Once | INTRAVENOUS | Status: AC
  Administered 2013-04-06: 500 [IU] via INTRAVENOUS
  Filled 2013-04-06: qty 5

## 2013-04-06 MED ORDER — SODIUM CHLORIDE 0.9 % IJ SOLN
10.0000 mL | INTRAMUSCULAR | Status: DC | PRN
  Administered 2013-04-06: 10 mL via INTRAVENOUS
  Filled 2013-04-06: qty 10

## 2013-04-06 NOTE — Progress Notes (Signed)
[name] presented for Portacath access and flush. Proper placement of portacath confirmed by CXR. Portacath located rt chest wall accessed with  H 20 needle. Good blood return present. Portacath flushed with 20ml NS and 500U/61ml Heparin and needle removed intact. Procedure without incident. Patient tolerated procedure well.

## 2013-05-29 ENCOUNTER — Encounter (HOSPITAL_COMMUNITY): Payer: Medicare Other

## 2013-05-29 ENCOUNTER — Encounter (HOSPITAL_COMMUNITY): Payer: Medicare Other | Attending: Oncology

## 2013-05-29 DIAGNOSIS — Z9889 Other specified postprocedural states: Secondary | ICD-10-CM | POA: Insufficient documentation

## 2013-05-29 DIAGNOSIS — C189 Malignant neoplasm of colon, unspecified: Secondary | ICD-10-CM

## 2013-05-29 DIAGNOSIS — Z95828 Presence of other vascular implants and grafts: Secondary | ICD-10-CM | POA: Insufficient documentation

## 2013-05-29 LAB — CBC WITH DIFFERENTIAL/PLATELET
Basophils Absolute: 0 10*3/uL (ref 0.0–0.1)
Lymphocytes Relative: 25 % (ref 12–46)
MCH: 28.5 pg (ref 26.0–34.0)
MCHC: 33.1 g/dL (ref 30.0–36.0)
MCV: 86.2 fL (ref 78.0–100.0)
Monocytes Absolute: 0.4 10*3/uL (ref 0.1–1.0)
Monocytes Relative: 8 % (ref 3–12)
Neutro Abs: 3.3 10*3/uL (ref 1.7–7.7)
RBC: 5.01 MIL/uL (ref 3.87–5.11)

## 2013-05-29 LAB — COMPREHENSIVE METABOLIC PANEL
AST: 15 U/L (ref 0–37)
Alkaline Phosphatase: 63 U/L (ref 39–117)
CO2: 27 mEq/L (ref 19–32)
Chloride: 104 mEq/L (ref 96–112)
Creatinine, Ser: 0.6 mg/dL (ref 0.50–1.10)
GFR calc non Af Amer: 86 mL/min — ABNORMAL LOW (ref >90)
Total Bilirubin: 1.9 mg/dL — ABNORMAL HIGH (ref 0.3–1.2)

## 2013-05-29 MED ORDER — HEPARIN SOD (PORK) LOCK FLUSH 100 UNIT/ML IV SOLN
500.0000 [IU] | Freq: Once | INTRAVENOUS | Status: AC
  Administered 2013-05-29: 500 [IU] via INTRAVENOUS

## 2013-05-29 MED ORDER — SODIUM CHLORIDE 0.9 % IJ SOLN
10.0000 mL | INTRAMUSCULAR | Status: AC | PRN
  Administered 2013-05-29: 10 mL via INTRAVENOUS

## 2013-05-29 MED ORDER — HEPARIN SOD (PORK) LOCK FLUSH 100 UNIT/ML IV SOLN
INTRAVENOUS | Status: AC
  Filled 2013-05-29: qty 5

## 2013-05-29 NOTE — Progress Notes (Signed)
[name] presented for Portacath access and flush. Proper placement of portacath confirmed by CXR. Portacath located right chest wall accessed with  H 20 needle. Good blood return present. Portacath flushed with 20ml NS and 500U/74ml Heparin and needle removed intact. Procedure without incident. Patient tolerated procedure well.

## 2013-05-29 NOTE — Addendum Note (Signed)
Addended by: Laural Benes C on: 05/29/2013 10:41 AM   Modules accepted: Orders

## 2013-05-30 ENCOUNTER — Ambulatory Visit (HOSPITAL_COMMUNITY): Payer: Medicare Other | Admitting: Oncology

## 2013-05-31 ENCOUNTER — Encounter (HOSPITAL_COMMUNITY)
Admission: RE | Admit: 2013-05-31 | Discharge: 2013-05-31 | Disposition: A | Discharge status: Home / Self Care | Payer: Medicare Other | Source: Ambulatory Visit | Attending: Oncology | Admitting: Oncology

## 2013-05-31 DIAGNOSIS — C189 Malignant neoplasm of colon, unspecified: Secondary | ICD-10-CM | POA: Insufficient documentation

## 2013-05-31 DIAGNOSIS — Z980 Intestinal bypass and anastomosis status: Secondary | ICD-10-CM | POA: Insufficient documentation

## 2013-05-31 DIAGNOSIS — K8020 Calculus of gallbladder without cholecystitis without obstruction: Secondary | ICD-10-CM | POA: Insufficient documentation

## 2013-05-31 LAB — GLUCOSE, CAPILLARY: Glucose-Capillary: 95 mg/dL (ref 70–99)

## 2013-05-31 MED ORDER — FLUDEOXYGLUCOSE F - 18 (FDG) INJECTION
17.0000 | Freq: Once | INTRAVENOUS | Status: AC | PRN
  Administered 2013-05-31: 17 via INTRAVENOUS

## 2013-06-04 ENCOUNTER — Encounter (HOSPITAL_BASED_OUTPATIENT_CLINIC_OR_DEPARTMENT_OTHER): Payer: Medicare Other

## 2013-06-04 ENCOUNTER — Encounter (HOSPITAL_COMMUNITY): Payer: Self-pay

## 2013-06-04 VITALS — BP 118/83 | HR 92 | Temp 97.6°F | Resp 18 | Wt 132.9 lb

## 2013-06-04 DIAGNOSIS — M069 Rheumatoid arthritis, unspecified: Secondary | ICD-10-CM

## 2013-06-04 DIAGNOSIS — C50919 Malignant neoplasm of unspecified site of unspecified female breast: Secondary | ICD-10-CM

## 2013-06-04 DIAGNOSIS — C569 Malignant neoplasm of unspecified ovary: Secondary | ICD-10-CM

## 2013-06-04 DIAGNOSIS — C189 Malignant neoplasm of colon, unspecified: Secondary | ICD-10-CM

## 2013-06-04 NOTE — Patient Instructions (Addendum)
.  [name] Discharge Instructions  RECOMMENDATIONS MADE BY THE CONSULTANT AND ANY TEST RESULTS WILL BE SENT TO YOUR REFERRING PHYSICIAN.  EXAM FINDINGS BY THE PHYSICIAN TODAY AND SIGNS OR SYMPTOMS TO REPORT TO CLINIC OR PRIMARY PHYSICIAN: Exam and findings as discussed by Dr. Zigmund Daniel.  INSTRUCTIONS/FOLLOW-UP: We will refer you to Dr. Karilyn Cota for evaluation of spot at anastomosis See [place] back in 6 months Port flush every 6 weeks Thank you for choosing [facility] to provide your oncology and hematology care.  To afford each patient quality time with our providers, please arrive at least 15 minutes before your scheduled appointment time.  With your help, our goal is to use those 15 minutes to complete the necessary work-up to ensure our physicians have the information they need to help with your evaluation and healthcare recommendations.    Effective January 1st, 2014, we ask that you re-schedule your appointment with our physicians should you arrive 10 or more minutes late for your appointment.  We strive to give you quality time with our providers, and arriving late affects you and other patients whose appointments are after yours.    Again, thank you for choosing [facility].  Our hope is that these requests will decrease the amount of time that you wait before being seen by our physicians.       _____________________________________________________________  Should you have questions after your visit to [facility], please contact our office at [phone] between the hours of 8:30 a.m. and 5:00 p.m.  Voicemails left after 4:30 p.m. will not be returned until the following business day.  For prescription refill requests, have your pharmacy contact our office with your prescription refill request.

## 2013-06-04 NOTE — Progress Notes (Signed)
[facility] OFFICE PROGRESS NOTE  LUKING,SCOTT, MD [address] [place] [address]  DIAGNOSIS: Recurrent adenocarcinoma of colon - Plan: CBC with Differential, CEA, Comprehensive metabolic panel, Ferritin  Rheumatoid arthritis(714.0)  Ovarian cancer, unspecified laterality - Plan: CA 125  Chief Complaint  Patient presents with  . Colon Cancer    CURRENT THERAPY: Watchful expectation l INTERVAL HISTORY: [name] 79 y.o. female returns for followup of stage IV recurrent colon cancer with pelvic involvement, status post 6 cycles of Xeloda of being with the last dose given September of 2012, negative workup since.  She remains asymptomatic good appetite and no nausea, vomiting, change in bowel habits, melena, hematochezia, vaginal bleeding, hematuria, incontinence, lower extremity swelling or redness, fever, night sweats, cough, wheezing, skin rash, joint pain, headache, or seizures.  MEDICAL HISTORY: Past Medical History  Diagnosis Date  . Ovarian cancer     s/p TAH/BSO  . Colon cancer metastasized to multiple sites 03/15/2011    s/p partial colectomy and chemotherapy; 04/2011-negative PET scan  . History of polymyalgia rheumatica   . Rheumatoid arthritis(714.0)   . Fibromyalgia   . Anemia 07/18/2011    post op  . Arteriosclerotic cardiovascular disease (ASCVD)     NSTEMI 07/07/11 - cath->3 v CAD; EF-45% ==>  CABG: L-LAD, S-D1, S-OM1/OM2, S-dRCA with Dr. [name] 11/12  . Atrial fibrillation     post op; tx with amiodarone and DCCV  . Hyperlipidemia   . Hypertension   . Tobacco abuse, in remission     55 pack years; discontinued 1995  . Pneumothorax 1960  . Asthmatic bronchitis   . Cholelithiasis     Asymptomatic; identified on CT scan    INTERIM HISTORY: has Recurrent adenocarcinoma of colon; Hypertension; Ovarian cancer; History of polymyalgia rheumatica; Rheumatoid arthritis(714.0); Fibromyalgia; Arteriosclerotic cardiovascular  disease (ASCVD); Hyperlipidemia; Asthmatic bronchitis; and Port catheter in place on her problem list.    ALLERGIES:  is allergic to sulfa antibiotics.  MEDICATIONS: has a current medication list which includes the following prescription(s): aspirin, atorvastatin, and fish oil-omega-3 fatty acids, and the following Facility-Administered Medications: sodium chloride.  SURGICAL HISTORY:  Past Surgical History  Procedure Laterality Date  . Total abdominal hysterectomy w/ bilateral salpingoophorectomy  2003    ovarian carcinoma  . Appendectomy    . Tonsillectomy    . Anterior fascia resection via laparotomy    . Bladder surgery  Dec 2011  . Coronary artery bypass graft  07/12/2011    Procedure: CORONARY ARTERY BYPASS GRAFTING (CABG)TIMES FIVE;  Surgeon: Loreli Slot, MD;  Location: [facility] OR;  Service: Open Heart Surgery;  Laterality: N/A;  utilizing left internal mammary artery and bilateral saphenous veins harvested endoscopically  . Partial colectomy  2001    Colon carcinoma    FAMILY HISTORY: family history includes Coronary artery disease in her son; Heart disease in her father and mother.  SOCIAL HISTORY:  reports that she quit smoking about 14 years ago. Her smoking use included Cigarettes. She has a 56 pack-year smoking history. She has never used smokeless tobacco. She reports that she drinks about 0.5 ounces of alcohol per week. She reports that she does not use illicit drugs.  REVIEW OF SYSTEMS:  Other than that discussed above is noncontributory.  PHYSICAL EXAMINATION: ECOG PERFORMANCE STATUS: 0 - Asymptomatic  Blood pressure 118/83, pulse 92, temperature 97.6 F (36.4 C), temperature source Oral, resp. rate 18, weight 132 lb 14.4 oz (60.283 kg).  GENERAL:alert, no distress and comfortable  SKIN: skin color, texture, turgor are normal, no rashes or significant lesions EYES: normal, Conjunctiva are pink and non-injected, sclera clear OROPHARYNX:no exudate, no erythema and  lips, buccal mucosa, and tongue normal  NECK: supple, thyroid normal size, non-tender, without nodularity CHEST: Normal AP diameter. LYMPH:  no palpable lymphadenopathy in the cervical, axillary or inguinal LUNGS: clear to auscultation and percussion with normal breathing effort HEART: regular rate & rhythm and no murmurs and no lower extremity edema ABDOMEN:abdomen soft, non-tender and normal bowel sounds Musculoskeletal:no cyanosis of digits and no clubbing  NEURO: alert & oriented x 3 with fluent speech, no focal motor/sensory deficits   LABORATORY DATA: Hospital Outpatient Visit on 05/31/2013  Component Date Value Range Status  . Glucose-Capillary 05/31/2013 95  70 - 99 mg/dL Final  Infusion on 16/06/9603  Component Date Value Range Status  . WBC 05/29/2013 5.2  4.0 - 10.5 K/uL Final  . RBC 05/29/2013 5.01  3.87 - 5.11 MIL/uL Final  . Hemoglobin 05/29/2013 14.3  12.0 - 15.0 g/dL Final  . HCT 54/05/8118 43.2  36.0 - 46.0 % Final  . MCV 05/29/2013 86.2  78.0 - 100.0 fL Final  . MCH 05/29/2013 28.5  26.0 - 34.0 pg Final  . MCHC 05/29/2013 33.1  30.0 - 36.0 g/dL Final  . RDW 14/78/2956 14.3  11.5 - 15.5 % Final  . Platelets 05/29/2013 155  150 - 400 K/uL Final  . Neutrophils Relative % 05/29/2013 64  43 - 77 % Final  . Neutro Abs 05/29/2013 3.3  1.7 - 7.7 K/uL Final  . Lymphocytes Relative 05/29/2013 25  12 - 46 % Final  . Lymphs Abs 05/29/2013 1.3  0.7 - 4.0 K/uL Final  . Monocytes Relative 05/29/2013 8  3 - 12 % Final  . Monocytes Absolute 05/29/2013 0.4  0.1 - 1.0 K/uL Final  . Eosinophils Relative 05/29/2013 2  0 - 5 % Final  . Eosinophils Absolute 05/29/2013 0.1  0.0 - 0.7 K/uL Final  . Basophils Relative 05/29/2013 1  0 - 1 % Final  . Basophils Absolute 05/29/2013 0.0  0.0 - 0.1 K/uL Final  . Sodium 05/29/2013 140  135 - 145 mEq/L Final  . Potassium 05/29/2013 4.0  3.5 - 5.1 mEq/L Final  . Chloride 05/29/2013 104  96 - 112 mEq/L Final  . CO2 05/29/2013 27  19 - 32 mEq/L  Final  . Glucose, Bld 05/29/2013 100* 70 - 99 mg/dL Final  . BUN 21/30/8657 15  6 - 23 mg/dL Final  . Creatinine, Ser 05/29/2013 0.60  0.50 - 1.10 mg/dL Final  . Calcium 84/69/6295 9.4  8.4 - 10.5 mg/dL Final  . Total Protein 05/29/2013 6.9  6.0 - 8.3 g/dL Final  . Albumin 28/41/3244 3.9  3.5 - 5.2 g/dL Final  . AST 09/08/7251 15  0 - 37 U/L Final  . ALT 05/29/2013 11  0 - 35 U/L Final  . Alkaline Phosphatase 05/29/2013 63  39 - 117 U/L Final  . Total Bilirubin 05/29/2013 1.9* 0.3 - 1.2 mg/dL Final  . GFR calc non Af Amer 05/29/2013 86* >90 mL/min Final  . GFR calc Af Amer 05/29/2013 >90  >90 mL/min Final   Comment: (NOTE)                          The eGFR has been calculated using the CKD EPI equation.  This calculation has not been validated in all clinical situations.                          eGFR's persistently <90 mL/min signify possible Chronic Kidney                          Disease.  . CEA 05/29/2013 2.0  0.0 - 5.0 ng/mL Final   Performed at [facility]     Urinalysis    Component Value Date/Time   COLORURINE YELLOW 07/11/2011 2207   APPEARANCEUR CLOUDY* 07/11/2011 2207   LABSPEC 1.014 07/11/2011 2207   PHURINE 6.0 07/11/2011 2207   GLUCOSEU NEGATIVE 07/11/2011 2207   HGBUR SMALL* 07/11/2011 2207   BILIRUBINUR NEGATIVE 07/11/2011 2207   KETONESUR NEGATIVE 07/11/2011 2207   PROTEINUR NEGATIVE 07/11/2011 2207   UROBILINOGEN 0.2 07/11/2011 2207   NITRITE NEGATIVE 07/11/2011 2207   LEUKOCYTESUR NEGATIVE 07/11/2011 2207    RADIOGRAPHIC STUDIES: Nm Pet Image Restag (ps) Skull Base To Thigh  05/31/2013   CLINICAL DATA:  Subsequent treatment strategy for colon carcinoma.  EXAM: NUCLEAR MEDICINE PET SKULL BASE TO THIGH  FASTING BLOOD GLUCOSE:  Value:  95 mg/dl  TECHNIQUE: 21.3 mCi Y-86 FDG was injected intravenously. CT data was obtained and used for attenuation correction and anatomic localization only. (This was not acquired as a diagnostic CT  examination.) Additional exam technical data entered on technologist worksheet.  COMPARISON:  11/22/12  FINDINGS: NECK  No hypermetabolic lymph nodes in the neck.  CHEST  No hypermetabolic mediastinal or hilar nodes. No suspicious pulmonary nodules on the CT scan.  ABDOMEN/PELVIS  No abnormal hypermetabolic activity within the liver, pancreas, adrenal glands, or spleen. No hypermetabolic lymph nodes in the abdomen or pelvis.  Subtle focal intraluminal soft tissue prominence is noted at the site of the sigmoid colon anastomotic staples in the central pelvis on image 186. This shows focal hypermetabolic uptake at this site which is slightly more than other areas of physiologic uptake within the colon. Local recurrence of carcinoma at the anastomotic site cannot be excluded. Cholelithiasis again noted.  SKELETON  No focal hypermetabolic activity to suggest skeletal metastasis.  IMPRESSION: No evidence of metastatic disease.  Subtle intraluminal soft tissue prominence with hypermetabolic activity at site of sigmoid colon anastomosis. Local recurrence of colon carcinoma at the anastomotic site cannot be excluded; colonoscopy is suggested for further evaluation.  Cholelithiasis again noted. No radiographic evidence of cholecystitis.   Electronically Signed   By: Myles Rosenthal   On: 05/31/2013 10:58    ASSESSMENT: #1. Stage IV colon carcinomastatus post 6 cycles of capecitabine with her last dose in September 2012 for recurrence in the pelvis, close to the symphysis pubis. Thus far she has not had a recurrence since stopping the capecitabine. The PET scan was positive and the size of the lesion was 1.3 cm. Her laboratory work shows her CEA remains within the normal range. Her liver enzymes also remain within the normal range. She is asymptomatic on oncology review of systems today. Most recent PET scan with an area of abnormality near the anastomosis of her previous colon surgery. #2. Coronary artery disease, status post  CABG in November 2012, no evidence of dysrhythmia or heart failure. #3. Hyperlipidemia, on treatment. #4. Hypertension, controlled. #5. History of atrial fibrillation, currently in sinus rhythm.   PLAN: #1. Refer to Dr. Karilyn Cota for consideration of colonoscopy to explain the abnormal PET  scan at the anastomotic site. #2. Followup in 6 months.   All questions were answered. The patient knows to call the clinic with any problems, questions or concerns. We can certainly see the patient much sooner if necessary.   I spent 30 minutes counseling the patient face to face. The total time spent in the appointment was 25 minutes.    Maurilio Lovely, MD 06/04/2013 2:04 PM

## 2013-07-10 ENCOUNTER — Other Ambulatory Visit (HOSPITAL_COMMUNITY): Payer: Medicare Other

## 2013-07-30 ENCOUNTER — Encounter (HOSPITAL_COMMUNITY): Payer: Medicare Other

## 2013-09-04 ENCOUNTER — Encounter (INDEPENDENT_AMBULATORY_CARE_PROVIDER_SITE_OTHER): Payer: Self-pay | Admitting: *Deleted

## 2013-09-05 ENCOUNTER — Encounter (INDEPENDENT_AMBULATORY_CARE_PROVIDER_SITE_OTHER): Payer: Self-pay | Admitting: *Deleted

## 2013-09-17 ENCOUNTER — Ambulatory Visit (INDEPENDENT_AMBULATORY_CARE_PROVIDER_SITE_OTHER): Payer: Medicare Other | Admitting: Internal Medicine

## 2013-10-01 ENCOUNTER — Other Ambulatory Visit (HOSPITAL_COMMUNITY): Payer: Self-pay | Admitting: Oncology

## 2013-10-01 DIAGNOSIS — C189 Malignant neoplasm of colon, unspecified: Secondary | ICD-10-CM

## 2013-10-02 ENCOUNTER — Encounter (HOSPITAL_COMMUNITY): Payer: Self-pay | Admitting: Pharmacy Technician

## 2013-10-02 ENCOUNTER — Telehealth (INDEPENDENT_AMBULATORY_CARE_PROVIDER_SITE_OTHER): Payer: Self-pay | Admitting: *Deleted

## 2013-10-02 ENCOUNTER — Other Ambulatory Visit (INDEPENDENT_AMBULATORY_CARE_PROVIDER_SITE_OTHER): Payer: Self-pay | Admitting: *Deleted

## 2013-10-02 ENCOUNTER — Encounter (INDEPENDENT_AMBULATORY_CARE_PROVIDER_SITE_OTHER): Payer: Self-pay

## 2013-10-02 DIAGNOSIS — Z85038 Personal history of other malignant neoplasm of large intestine: Secondary | ICD-10-CM

## 2013-10-02 DIAGNOSIS — Z1211 Encounter for screening for malignant neoplasm of colon: Secondary | ICD-10-CM

## 2013-10-02 MED ORDER — PEG 3350-KCL-NA BICARB-NACL 420 G PO SOLR: 4000.0000 mL | Freq: Once | ORAL | Status: DC

## 2013-10-02 NOTE — Telephone Encounter (Signed)
  Procedure: tcs  Reason/Indication:  Hx colon ca, abnormal PET scan  Has patient had this procedure before?  Flex sig 2009 -- scanned  If so, when, by whom and where?    Is there a family history of colon cancer?  no  Who?  What age when diagnosed?    Is patient diabetic?   no      Does patient have prosthetic heart valve?  no  Do you have a pacemaker?  no  Has patient ever had endocarditis? no  Has patient had joint replacement within last 12 months?  no  Does patient tend to be constipated or take laxatives? no  Is patient on Coumadin, Plavix and/or Aspirin? yes  Medications: atorvastatin 20 mg daily, asa 81 mg daily  Allergies: nkda  Medication Adjustment: asa 2 days  Procedure date & time: 10/04/13 at 245

## 2013-10-02 NOTE — Telephone Encounter (Signed)
Patient needs trilyte 

## 2013-10-02 NOTE — Telephone Encounter (Signed)
agree

## 2013-10-04 ENCOUNTER — Ambulatory Visit (HOSPITAL_COMMUNITY)
Admission: RE | Admit: 2013-10-04 | Discharge: 2013-10-04 | Disposition: A | Discharge status: Home / Self Care | Payer: Medicare Other | Source: Ambulatory Visit | Attending: Internal Medicine | Admitting: Internal Medicine

## 2013-10-04 ENCOUNTER — Encounter (HOSPITAL_COMMUNITY)
Admission: RE | Disposition: A | Discharge status: Home / Self Care | Payer: Self-pay | Source: Ambulatory Visit | Attending: Internal Medicine

## 2013-10-04 ENCOUNTER — Encounter (HOSPITAL_COMMUNITY): Payer: Self-pay

## 2013-10-04 DIAGNOSIS — Z951 Presence of aortocoronary bypass graft: Secondary | ICD-10-CM | POA: Insufficient documentation

## 2013-10-04 DIAGNOSIS — R933 Abnormal findings on diagnostic imaging of other parts of digestive tract: Secondary | ICD-10-CM

## 2013-10-04 DIAGNOSIS — Z85038 Personal history of other malignant neoplasm of large intestine: Secondary | ICD-10-CM

## 2013-10-04 DIAGNOSIS — K644 Residual hemorrhoidal skin tags: Secondary | ICD-10-CM

## 2013-10-04 DIAGNOSIS — Z9049 Acquired absence of other specified parts of digestive tract: Secondary | ICD-10-CM | POA: Insufficient documentation

## 2013-10-04 DIAGNOSIS — Z7982 Long term (current) use of aspirin: Secondary | ICD-10-CM | POA: Insufficient documentation

## 2013-10-04 DIAGNOSIS — I10 Essential (primary) hypertension: Secondary | ICD-10-CM | POA: Insufficient documentation

## 2013-10-04 DIAGNOSIS — Z8543 Personal history of malignant neoplasm of ovary: Secondary | ICD-10-CM | POA: Insufficient documentation

## 2013-10-04 DIAGNOSIS — Z87891 Personal history of nicotine dependence: Secondary | ICD-10-CM | POA: Insufficient documentation

## 2013-10-04 HISTORY — PX: FLEXIBLE SIGMOIDOSCOPY: SHX5431

## 2013-10-04 SURGERY — SIGMOIDOSCOPY, FLEXIBLE
Anesthesia: Moderate Sedation

## 2013-10-04 MED ORDER — STERILE WATER FOR IRRIGATION IR SOLN
Status: DC | PRN
  Administered 2013-10-04: 11:00:00

## 2013-10-04 MED ORDER — MEPERIDINE HCL 50 MG/ML IJ SOLN
INTRAMUSCULAR | Status: AC
  Filled 2013-10-04: qty 1

## 2013-10-04 MED ORDER — MEPERIDINE HCL 50 MG/ML IJ SOLN
INTRAMUSCULAR | Status: DC | PRN
  Administered 2013-10-04: 25 mg via INTRAVENOUS

## 2013-10-04 MED ORDER — SODIUM CHLORIDE 0.9 % IV SOLN
INTRAVENOUS | Status: DC
  Administered 2013-10-04: 10:00:00 via INTRAVENOUS

## 2013-10-04 MED ORDER — MIDAZOLAM HCL 5 MG/5ML IJ SOLN
INTRAMUSCULAR | Status: AC
  Filled 2013-10-04: qty 10

## 2013-10-04 MED ORDER — MIDAZOLAM HCL 5 MG/5ML IJ SOLN
INTRAMUSCULAR | Status: DC | PRN
  Administered 2013-10-04: 2 mg via INTRAVENOUS

## 2013-10-04 NOTE — Discharge Instructions (Signed)
Resume usual diet and medications. No driving for 24 hours. Considerer next sigmoidoscopy in 3 years.   Flexible Sigmoidoscopy Your caregiver has ordered a flexible sigmoidoscopy. This is an exam to evaluate your lower colon. In this exam your colon is cleansed and a short fiber optic tube is inserted through your rectum and into your colon. The fiber optic scope (endoscope) is a short bundle of enclosed flexible small glass fibers. It transmits light to the area examined and images from that area to your caregiver. You do not have to worry about glass breakage in the endoscope. Discomfort is usually minimal. Sedatives and pain medications are generally not required. This exam helps to detect tumors (lumps), polyps, inflammation (swelling and soreness), and areas of bleeding. It may also be used to take biopsies. These are small pieces of tissue taken to examine under a microscope. LET YOUR CAREGIVER KNOW ABOUT:  Allergies.  Medications taken including herbs, eye drops, over the counter medications, and creams.  Use of steroids (by mouth or creams).  Previous problems with anesthetics or novocaine  Possibility of pregnancy, if this applies.  History of blood clots (thrombophlebitis).  History of bleeding or blood problems.  Previous surgery.  Other health problems. BEFORE THE PROCEDURE Eat normally the night before the exam. Your caregiver may order a mild enema or laxative the night before. No eating or drinking should occur after midnight until the procedure is completed. A rectal suppository or enemas may be given in the morning prior to your procedure. You will be brought to the examination area in a hospital gown. You should be present 60 minutes prior to your procedure or as directed.  AFTER THE PROCEDURE   There is sometimes a little blood passed with the first bowel movement. Do not be concerned. Because air is often used during the exam, it is not unusual to pass gas and  experience abdominal (belly) cramping. Walking or a warm pack on your abdomen may help with this. Do not sleep with a heating pad as burns can occur.  You may resume all normal eating and activities.  Only take over-the-counter or prescription medicines for pain, discomfort, or fever as directed by your caregiver. Do not use aspirin or blood thinners if a biopsy (tissue sample) was taken. Consult your caregiver for medication usage if biopsies were taken.  Call for your results as instructed by your caregiver. Remember, it is your responsibility to obtain the results of your biopsy. Do not assume everything is fine because you do not hear from your caregiver. SEEK IMMEDIATE MEDICAL CARE IF:  An oral temperature above 102 F (38.9 C) develops.  You pass large blood clots or fill a toilet with blood following the procedure. This may also occur 10 to 14 days following the procedure. It is more likely if a biopsy was taken.  You develop abdominal pain not relieved with medication or that is getting worse rather than better. Document Released: 08/20/2000 Document Revised: 11/15/2011 Document Reviewed: 06/02/2005 [facility] Patient Information 2014 [place].

## 2013-10-04 NOTE — H&P (Signed)
[name] is an 80 y.o. female.   Chief Complaint: Patient's here for flexible sigmoidoscopy. HPI: Patient is 80 year old Caucasian female with history of metastatic colon carcinoma whose initial surgery was in 2001 followed by recurrence and the second surgery. She another pelvic meds and 2012 and received chemotherapy. She a PET scan in September 2014 which showed increased activity in the region of anastomosis. He denies abdominal pain change in her bowel habits or rectal bleeding. History is negative for CRC.  Past Medical History  Diagnosis Date  . Ovarian cancer     s/p TAH/BSO  . Colon cancer metastasized to multiple sites 03/15/2011    s/p partial colectomy and chemotherapy; 04/2011-negative PET scan  . History of polymyalgia rheumatica   . Rheumatoid arthritis(714.0)   . Fibromyalgia   . Anemia 07/18/2011    post op  . Arteriosclerotic cardiovascular disease (ASCVD)     NSTEMI 07/07/11 - cath->3 v CAD; EF-45% ==>  CABG: L-LAD, S-D1, S-OM1/OM2, S-dRCA with Dr. Roxan Hockey 11/12  . Atrial fibrillation     post op; tx with amiodarone and DCCV  . Hyperlipidemia   . Hypertension   . Tobacco abuse, in remission     55 pack years; discontinued 1995  . Pneumothorax 1960  . Asthmatic bronchitis   . Cholelithiasis     Asymptomatic; identified on CT scan    Past Surgical History  Procedure Laterality Date  . Total abdominal hysterectomy w/ bilateral salpingoophorectomy  2003    ovarian carcinoma  . Appendectomy    . Tonsillectomy    . Anterior fascia resection via laparotomy    . Bladder surgery  Dec 2011  . Coronary artery bypass graft  07/12/2011    Procedure: CORONARY ARTERY BYPASS GRAFTING (CABG)TIMES FIVE;  Surgeon: Melrose Nakayama, MD;  Location: [facility];  Service: Open Heart Surgery;  Laterality: N/A;  utilizing left internal mammary artery and bilateral saphenous veins harvested endoscopically  . Partial colectomy  2001    Colon carcinoma    Family History   Problem Relation Age of Onset  . Heart disease Mother     CABG, MVR, aortic aneurysm  . Heart disease Father     Questionable history in his 69s  . Coronary artery disease Son     s/p heart transplant for ischemic cardiomyopathy   Social History:  reports that she quit smoking about 15 years ago. Her smoking use included Cigarettes. She has a 56 pack-year smoking history. She has never used smokeless tobacco. She reports that she drinks about 0.5 ounces of alcohol per week. She reports that she does not use illicit drugs.  Allergies:  Allergies  Allergen Reactions  . Sulfa Antibiotics     Medications Prior to Admission  Medication Sig Dispense Refill  . aspirin 81 MG tablet Take 81 mg by mouth daily.      Marland Kitchen atorvastatin (LIPITOR) 20 MG tablet Take 1 tablet (20 mg total) by mouth daily.  90 tablet  3  . fish oil-omega-3 fatty acids 1000 MG capsule Take 1 g by mouth daily.       . polyethylene glycol-electrolytes (NULYTELY/GOLYTELY) 420 G solution Take 4,000 mLs by mouth once.  4000 mL  0    No results found for this or any previous visit (from the past 48 hour(s)). No results found.  ROS  Blood pressure 133/82, pulse 71, temperature 97.6 F (36.4 C), temperature source Oral, resp. rate 16, height 5\' 5"  (1.651 m), weight 128 lb (58.06 kg).  Physical Exam  Constitutional: She appears well-developed and well-nourished.  HENT:  Mouth/Throat: Oropharynx is clear and moist.  Eyes: Conjunctivae are normal. No scleral icterus.  Neck: No thyromegaly present.  Cardiovascular: Normal rate and regular rhythm.   Murmur: Faint high-pitched systolic murmur at LLSB. Respiratory: Effort normal and breath sounds normal.  GI: Soft. She exhibits no distension and no mass. There is no tenderness.  Lower midline scar   Musculoskeletal: She exhibits no edema.  Lymphadenopathy:    She has no cervical adenopathy.  Neurological: She is alert.  Skin: Skin is warm and dry.      Assessment/Plan History of colon carcinoma. Abnormal PET scan with increased activity at ileocolonic anastomosis. Flexible sigmoidoscopy.  REHMAN,NAJEEB U 10/04/2013, 10:35 AM

## 2013-10-04 NOTE — Op Note (Signed)
FLEXIBLE SIGMOIDOSCOPY  PROCEDURE REPORT  PATIENT:  [name]  MR#:  [id number] Birthdate:  [birth year], 80 y.o., female Endoscopist:  Dr. Rogene Houston, MD Referred By:  Dr. Everardo All, MD Procedure Date: 10/04/2013  Procedure:   Flexible sigmoidoscopy.  Indications: Patient is 80 year old Asian female with history of metastatic colon carcinoma remains in remission. She had a PET scan which reveals increased activity in the region of ileocolonic anastomosis. She is undergoing diagnostic examination. Her last flexible sigmoidoscopy was in 2009 and was negative.  Informed Consent:  The procedure and risks were reviewed with the patient and informed consent was obtained.  Medications:  Demerol 25 mg IV Versed 2 mg IV  Description of procedure:  After a digital rectal exam was performed, that colonoscope was advanced from the anus through the rectum into the sigmoid colon and ileocolonic anastomosis located at 30 cm from the anal margin. Scope was passed into distal small bowel and then withdrawn. The mucosal surfaces were carefully surveyed utilizing scope tip to flexion to facilitate fold flattening as needed. The scope was pulled down into the rectum where a thorough exam including retroflexion was performed.  Findings:  Prep excellent. Normal mucosa of distal small bowel. Wide-open ileocolonic anastomosis located at 30 cm from the incisors. No polyps or other mucosal abnormalities noted in remaining sigmoid colon and rectum. Small hemorrhoids below the dentate line.   Therapeutic/Diagnostic Maneuvers Performed:   None  Complications:  None   Impression:  Normal mucosa of distal small bowel ileocolonic anastomosis. No abnormality noted to residual sigmoid colon and rectum. Small external hemorrhoids.  Recommendations:  Standard instructions given. May consider flexible sigmoidoscopy in 3 years.  REHMAN,NAJEEB U  10/04/2013 11:05 AM  CC: Dr. Sallee Lange, MD & Dr.  Rayne Du ref. provider found  CC: Dr. Everardo All, MD    \

## 2013-10-08 ENCOUNTER — Encounter (HOSPITAL_COMMUNITY): Payer: Self-pay | Admitting: Internal Medicine

## 2013-11-14 ENCOUNTER — Encounter (HOSPITAL_COMMUNITY)
Admission: RE | Admit: 2013-11-14 | Discharge: 2013-11-14 | Disposition: A | Discharge status: Home / Self Care | Payer: Medicare Other | Source: Ambulatory Visit | Attending: Oncology | Admitting: Oncology

## 2013-11-14 DIAGNOSIS — I517 Cardiomegaly: Secondary | ICD-10-CM | POA: Insufficient documentation

## 2013-11-14 DIAGNOSIS — K8020 Calculus of gallbladder without cholecystitis without obstruction: Secondary | ICD-10-CM | POA: Insufficient documentation

## 2013-11-14 DIAGNOSIS — C189 Malignant neoplasm of colon, unspecified: Secondary | ICD-10-CM | POA: Insufficient documentation

## 2013-11-14 DIAGNOSIS — Z90710 Acquired absence of both cervix and uterus: Secondary | ICD-10-CM | POA: Insufficient documentation

## 2013-11-14 DIAGNOSIS — I700 Atherosclerosis of aorta: Secondary | ICD-10-CM | POA: Insufficient documentation

## 2013-11-14 DIAGNOSIS — M949 Disorder of cartilage, unspecified: Secondary | ICD-10-CM

## 2013-11-14 DIAGNOSIS — Z8543 Personal history of malignant neoplasm of ovary: Secondary | ICD-10-CM | POA: Insufficient documentation

## 2013-11-14 DIAGNOSIS — M899 Disorder of bone, unspecified: Secondary | ICD-10-CM | POA: Insufficient documentation

## 2013-11-14 LAB — GLUCOSE, CAPILLARY: Glucose-Capillary: 96 mg/dL (ref 70–99)

## 2013-11-14 MED ORDER — FLUDEOXYGLUCOSE F - 18 (FDG) INJECTION
6.3000 | Freq: Once | INTRAVENOUS | Status: AC | PRN
  Administered 2013-11-14: 6.3 via INTRAVENOUS

## 2013-12-03 ENCOUNTER — Other Ambulatory Visit (HOSPITAL_COMMUNITY): Payer: Self-pay | Admitting: Oncology

## 2013-12-03 ENCOUNTER — Ambulatory Visit (HOSPITAL_COMMUNITY): Payer: Medicare Other

## 2013-12-03 DIAGNOSIS — C189 Malignant neoplasm of colon, unspecified: Secondary | ICD-10-CM

## 2014-05-23 ENCOUNTER — Encounter (HOSPITAL_COMMUNITY): Admission: RE | Admit: 2014-05-23 | Payer: Medicare Other | Source: Ambulatory Visit

## 2014-05-30 ENCOUNTER — Encounter (HOSPITAL_COMMUNITY)
Admission: RE | Admit: 2014-05-30 | Discharge: 2014-05-30 | Disposition: A | Discharge status: Home / Self Care | Payer: Medicare Other | Source: Ambulatory Visit | Attending: Diagnostic Radiology | Admitting: Diagnostic Radiology

## 2014-05-30 DIAGNOSIS — C189 Malignant neoplasm of colon, unspecified: Secondary | ICD-10-CM | POA: Diagnosis not present

## 2014-05-30 DIAGNOSIS — K6389 Other specified diseases of intestine: Secondary | ICD-10-CM | POA: Insufficient documentation

## 2014-05-30 LAB — GLUCOSE, CAPILLARY: GLUCOSE-CAPILLARY: 96 mg/dL (ref 70–99)

## 2014-05-30 MED ORDER — FLUDEOXYGLUCOSE F - 18 (FDG) INJECTION
6.3000 | Freq: Once | INTRAVENOUS | Status: AC | PRN
  Administered 2014-05-30: 6.3 via INTRAVENOUS

## 2014-06-10 ENCOUNTER — Other Ambulatory Visit (HOSPITAL_COMMUNITY): Payer: Self-pay | Admitting: Oncology

## 2014-06-10 DIAGNOSIS — C189 Malignant neoplasm of colon, unspecified: Secondary | ICD-10-CM

## 2014-09-26 ENCOUNTER — Ambulatory Visit (INDEPENDENT_AMBULATORY_CARE_PROVIDER_SITE_OTHER): Payer: Medicare Other | Admitting: Otolaryngology

## 2014-09-26 DIAGNOSIS — H9313 Tinnitus, bilateral: Secondary | ICD-10-CM

## 2014-09-26 DIAGNOSIS — H903 Sensorineural hearing loss, bilateral: Secondary | ICD-10-CM

## 2014-12-11 ENCOUNTER — Ambulatory Visit (HOSPITAL_COMMUNITY)
Admission: RE | Admit: 2014-12-11 | Discharge: 2014-12-11 | Disposition: A | Discharge status: Home / Self Care | Payer: Medicare Other | Source: Ambulatory Visit | Attending: Oncology | Admitting: Oncology

## 2014-12-11 DIAGNOSIS — K8020 Calculus of gallbladder without cholecystitis without obstruction: Secondary | ICD-10-CM | POA: Diagnosis not present

## 2014-12-11 DIAGNOSIS — C189 Malignant neoplasm of colon, unspecified: Secondary | ICD-10-CM | POA: Diagnosis not present

## 2014-12-11 DIAGNOSIS — Z08 Encounter for follow-up examination after completed treatment for malignant neoplasm: Secondary | ICD-10-CM | POA: Diagnosis present

## 2014-12-11 DIAGNOSIS — R932 Abnormal findings on diagnostic imaging of liver and biliary tract: Secondary | ICD-10-CM | POA: Insufficient documentation

## 2014-12-11 LAB — GLUCOSE, CAPILLARY: GLUCOSE-CAPILLARY: 92 mg/dL (ref 70–99)

## 2014-12-11 MED ORDER — FLUDEOXYGLUCOSE F - 18 (FDG) INJECTION
6.3800 | Freq: Once | INTRAVENOUS | Status: AC | PRN
  Administered 2014-12-11: 6.38 via INTRAVENOUS

## 2015-01-22 ENCOUNTER — Other Ambulatory Visit: Payer: Self-pay | Admitting: Cardiology

## 2015-01-29 ENCOUNTER — Other Ambulatory Visit: Payer: Self-pay | Admitting: Cardiology

## 2015-03-18 ENCOUNTER — Other Ambulatory Visit (HOSPITAL_COMMUNITY): Payer: Self-pay | Admitting: Oncology

## 2015-03-18 DIAGNOSIS — C189 Malignant neoplasm of colon, unspecified: Secondary | ICD-10-CM

## 2015-06-20 ENCOUNTER — Ambulatory Visit (HOSPITAL_COMMUNITY): Payer: Medicare Other

## 2015-06-30 ENCOUNTER — Ambulatory Visit (HOSPITAL_COMMUNITY): Payer: Medicare Other

## 2015-07-04 ENCOUNTER — Ambulatory Visit (HOSPITAL_COMMUNITY)
Admission: RE | Admit: 2015-07-04 | Discharge: 2015-07-04 | Disposition: A | Discharge status: Home / Self Care | Payer: Medicare Other | Source: Ambulatory Visit | Attending: Oncology | Admitting: Oncology

## 2015-07-04 DIAGNOSIS — Z79899 Other long term (current) drug therapy: Secondary | ICD-10-CM | POA: Insufficient documentation

## 2015-07-04 DIAGNOSIS — I517 Cardiomegaly: Secondary | ICD-10-CM | POA: Insufficient documentation

## 2015-07-04 DIAGNOSIS — C189 Malignant neoplasm of colon, unspecified: Secondary | ICD-10-CM | POA: Insufficient documentation

## 2015-07-04 DIAGNOSIS — K8020 Calculus of gallbladder without cholecystitis without obstruction: Secondary | ICD-10-CM | POA: Diagnosis not present

## 2015-07-04 LAB — GLUCOSE, CAPILLARY: Glucose-Capillary: 100 mg/dL — ABNORMAL HIGH (ref 65–99)

## 2015-07-04 MED ORDER — FLUDEOXYGLUCOSE F - 18 (FDG) INJECTION
6.7000 | Freq: Once | INTRAVENOUS | Status: DC | PRN
  Administered 2015-07-04: 6.7 via INTRAVENOUS
  Filled 2015-07-04: qty 6.7

## 2015-10-15 ENCOUNTER — Other Ambulatory Visit (HOSPITAL_COMMUNITY): Payer: Self-pay | Admitting: Oncology

## 2015-10-15 DIAGNOSIS — C189 Malignant neoplasm of colon, unspecified: Secondary | ICD-10-CM

## 2015-12-12 ENCOUNTER — Ambulatory Visit (HOSPITAL_COMMUNITY)
Admission: RE | Admit: 2015-12-12 | Discharge: 2015-12-12 | Disposition: A | Discharge status: Home / Self Care | Payer: Medicare Other | Source: Ambulatory Visit | Attending: Oncology | Admitting: Oncology

## 2015-12-12 DIAGNOSIS — S2232XA Fracture of one rib, left side, initial encounter for closed fracture: Secondary | ICD-10-CM | POA: Insufficient documentation

## 2015-12-12 DIAGNOSIS — C189 Malignant neoplasm of colon, unspecified: Secondary | ICD-10-CM | POA: Insufficient documentation

## 2015-12-12 LAB — GLUCOSE, CAPILLARY: GLUCOSE-CAPILLARY: 91 mg/dL (ref 65–99)

## 2015-12-12 MED ORDER — FLUDEOXYGLUCOSE F - 18 (FDG) INJECTION
6.3300 | Freq: Once | INTRAVENOUS | Status: AC | PRN
  Administered 2015-12-12: 6.33 via INTRAVENOUS

## 2015-12-22 NOTE — Patient Instructions (Signed)
[name]  12/22/2015     @PREFPERIOPPHARMACY @   Your procedure is scheduled on 01/01/2016.  Report to [name] at 9:40 A.M.  Call this number if you have problems the morning of surgery:  [phone]   Remember:  Do not eat food or drink liquids after midnight.  Take these medicines the morning of surgery with A SIP OF WATER None   Do not wear jewelry, make-up or nail polish.  Do not wear lotions, powders, or perfumes.  You may wear deodorant.  Do not shave 48 hours prior to surgery.  Men may shave face and neck.  Do not bring valuables to the hospital.  [facility] is not responsible for any belongings or valuables.  Contacts, dentures or bridgework may not be worn into surgery.  Leave your suitcase in the car.  After surgery it may be brought to your room.  For patients admitted to the hospital, discharge time will be determined by your treatment team.  Patients discharged the day of surgery will not be allowed to drive home.    Please read over the following fact sheets that you were given. Anesthesia Post-op Instructions     PATIENT INSTRUCTIONS POST-ANESTHESIA  IMMEDIATELY FOLLOWING SURGERY:  Do not drive or operate machinery for the first twenty four hours after surgery.  Do not make any important decisions for twenty four hours after surgery or while taking narcotic pain medications or sedatives.  If you develop intractable nausea and vomiting or a severe headache please notify your doctor immediately.  FOLLOW-UP:  Please make an appointment with your surgeon as instructed. You do not need to follow up with anesthesia unless specifically instructed to do so.  WOUND CARE INSTRUCTIONS (if applicable):  Keep a dry clean dressing on the anesthesia/puncture wound site if there is drainage.  Once the wound has quit draining you may leave it open to air.  Generally you should leave the bandage intact for twenty four hours unless there is drainage.  If the epidural  site drains for more than 36-48 hours please call the anesthesia department.  QUESTIONS?:  Please feel free to call your physician or the hospital operator if you have any questions, and they will be happy to assist you.       A cataract is a clouding of the lens of the eye. When a lens becomes cloudy, vision is reduced based on the degree and nature of the clouding. Surgery may be needed to improve vision. Surgery removes the cloudy lens and usually replaces it with a substitute lens (intraocular lens, IOL). LET YOUR EYE DOCTOR KNOW ABOUT:  Allergies to food or medicine.  Medicines taken including herbs, eye drops, over-the-counter medicines, and creams.  Use of steroids (by mouth or creams).  Previous problems with anesthetics or numbing medicine.  History of bleeding problems or blood clots.  Previous surgery.  Other health problems, including diabetes and kidney problems.  Possibility of pregnancy, if this applies. RISKS AND COMPLICATIONS  Infection.  Inflammation of the eyeball (endophthalmitis) that can spread to both eyes (sympathetic ophthalmia).  Poor wound healing.  If an IOL is inserted, it can later fall out of proper position. This is very uncommon.  Clouding of the part of your eye that holds an IOL in place. This is called an "after-cataract." These are uncommon but easily treated. BEFORE THE PROCEDURE  Do not eat or drink anything except small amounts of water for 8 to 12 before your surgery, or  as directed by your caregiver.  Unless you are told otherwise, continue any eye drops you have been prescribed.  Talk to your primary caregiver about all other medicines that you take (both prescription and nonprescription). In some cases, you may need to stop or change medicines near the time of your surgery. This is most important if you are taking blood-thinning medicine.Do not stop medicines unless you are told to do so.  Arrange for someone to drive you to and  from the procedure.  Do not put contact lenses in either eye on the day of your surgery. PROCEDURE There is more than one method for safely removing a cataract. Your doctor can explain the differences and help determine which is best for you. Phacoemulsification surgery is the most common form of cataract surgery.  An injection is given behind the eye or eye drops are given to make this a painless procedure.  A small cut (incision) is made on the edge of the clear, dome-shaped surface that covers the front of the eye (cornea).  A tiny probe is painlessly inserted into the eye. This device gives off ultrasound waves that soften and break up the cloudy center of the lens. This makes it easier for the cloudy lens to be removed by suction.  An IOL may be implanted.  The normal lens of the eye is covered by a clear capsule. Part of that capsule is intentionally left in the eye to support the IOL.  Your surgeon may or may not use stitches to close the incision. There are other forms of cataract surgery that require a larger incision and stitches to close the eye. This approach is taken in cases where the doctor feels that the cataract cannot be easily removed using phacoemulsification. AFTER THE PROCEDURE  When an IOL is implanted, it does not need care. It becomes a permanent part of your eye and cannot be seen or felt.  Your doctor will schedule follow-up exams to check on your progress.  Review your other medicines with your doctor to see which can be resumed after surgery.  Use eye drops or take medicine as prescribed by your doctor.   This information is not intended to replace advice given to you by your health care provider. Make sure you discuss any questions you have with your health care provider.   Document Released: 08/12/2011 Document Revised: 09/13/2014 Document Reviewed: 08/12/2011 Elsevier Interactive Patient Education [employer].

## 2015-12-23 ENCOUNTER — Encounter (HOSPITAL_COMMUNITY)
Admission: RE | Admit: 2015-12-23 | Discharge: 2015-12-23 | Disposition: A | Discharge status: Home / Self Care | Payer: Medicare Other | Source: Ambulatory Visit | Attending: Ophthalmology | Admitting: Ophthalmology

## 2015-12-23 ENCOUNTER — Other Ambulatory Visit: Payer: Self-pay

## 2015-12-23 ENCOUNTER — Encounter (HOSPITAL_COMMUNITY): Payer: Self-pay

## 2015-12-23 DIAGNOSIS — Z01810 Encounter for preprocedural cardiovascular examination: Secondary | ICD-10-CM | POA: Insufficient documentation

## 2015-12-23 DIAGNOSIS — Z01812 Encounter for preprocedural laboratory examination: Secondary | ICD-10-CM | POA: Insufficient documentation

## 2015-12-23 LAB — CBC
HEMATOCRIT: 43.8 % (ref 36.0–46.0)
HEMOGLOBIN: 14.5 g/dL (ref 12.0–15.0)
MCH: 28.9 pg (ref 26.0–34.0)
MCHC: 33.1 g/dL (ref 30.0–36.0)
MCV: 87.3 fL (ref 78.0–100.0)
Platelets: 162 10*3/uL (ref 150–400)
RBC: 5.02 MIL/uL (ref 3.87–5.11)
RDW: 14.4 % (ref 11.5–15.5)
WBC: 5.9 10*3/uL (ref 4.0–10.5)

## 2015-12-23 LAB — BASIC METABOLIC PANEL
Anion gap: 9 (ref 5–15)
BUN: 13 mg/dL (ref 6–20)
CHLORIDE: 106 mmol/L (ref 101–111)
CO2: 27 mmol/L (ref 22–32)
Calcium: 8.8 mg/dL — ABNORMAL LOW (ref 8.9–10.3)
Creatinine, Ser: 0.58 mg/dL (ref 0.44–1.00)
GFR calc Af Amer: >60 mL/min (ref >60)
GFR calc non Af Amer: >60 mL/min (ref >60)
GLUCOSE: 99 mg/dL (ref 65–99)
POTASSIUM: 3.8 mmol/L (ref 3.5–5.1)
Sodium: 142 mmol/L (ref 135–145)

## 2016-01-01 ENCOUNTER — Encounter (HOSPITAL_COMMUNITY)
Admission: RE | Disposition: A | Discharge status: Home / Self Care | Payer: Self-pay | Source: Ambulatory Visit | Attending: Ophthalmology

## 2016-01-01 ENCOUNTER — Encounter (HOSPITAL_COMMUNITY): Payer: Self-pay | Admitting: Ophthalmology

## 2016-01-01 ENCOUNTER — Ambulatory Visit (HOSPITAL_COMMUNITY): Payer: Medicare Other | Admitting: Anesthesiology

## 2016-01-01 ENCOUNTER — Ambulatory Visit (HOSPITAL_COMMUNITY)
Admission: RE | Admit: 2016-01-01 | Discharge: 2016-01-01 | Disposition: A | Discharge status: Home / Self Care | Payer: Medicare Other | Source: Ambulatory Visit | Attending: Ophthalmology | Admitting: Ophthalmology

## 2016-01-01 DIAGNOSIS — Z7982 Long term (current) use of aspirin: Secondary | ICD-10-CM | POA: Insufficient documentation

## 2016-01-01 DIAGNOSIS — M797 Fibromyalgia: Secondary | ICD-10-CM | POA: Diagnosis not present

## 2016-01-01 DIAGNOSIS — I2510 Atherosclerotic heart disease of native coronary artery without angina pectoris: Secondary | ICD-10-CM | POA: Diagnosis not present

## 2016-01-01 DIAGNOSIS — H25812 Combined forms of age-related cataract, left eye: Secondary | ICD-10-CM | POA: Diagnosis present

## 2016-01-01 DIAGNOSIS — M1991 Primary osteoarthritis, unspecified site: Secondary | ICD-10-CM | POA: Insufficient documentation

## 2016-01-01 DIAGNOSIS — I10 Essential (primary) hypertension: Secondary | ICD-10-CM | POA: Insufficient documentation

## 2016-01-01 DIAGNOSIS — Z79899 Other long term (current) drug therapy: Secondary | ICD-10-CM | POA: Diagnosis not present

## 2016-01-01 DIAGNOSIS — Z87891 Personal history of nicotine dependence: Secondary | ICD-10-CM | POA: Diagnosis not present

## 2016-01-01 HISTORY — PX: CATARACT EXTRACTION W/PHACO: SHX586

## 2016-01-01 SURGERY — PHACOEMULSIFICATION, CATARACT, WITH IOL INSERTION
Anesthesia: Monitor Anesthesia Care | Site: Eye | Laterality: Left

## 2016-01-01 MED ORDER — LIDOCAINE HCL (PF) 1 % IJ SOLN
INTRAMUSCULAR | Status: DC | PRN
  Administered 2016-01-01: .5 mL

## 2016-01-01 MED ORDER — CYCLOPENTOLATE-PHENYLEPHRINE OP SOLN OPTIME - NO CHARGE
OPHTHALMIC | Status: AC
  Filled 2016-01-01: qty 2

## 2016-01-01 MED ORDER — TETRACAINE HCL 0.5 % OP SOLN
OPHTHALMIC | Status: AC
  Filled 2016-01-01: qty 4

## 2016-01-01 MED ORDER — FENTANYL CITRATE (PF) 100 MCG/2ML IJ SOLN
INTRAMUSCULAR | Status: AC
  Filled 2016-01-01: qty 2

## 2016-01-01 MED ORDER — MIDAZOLAM HCL 2 MG/2ML IJ SOLN
1.0000 mg | INTRAMUSCULAR | Status: DC | PRN
  Administered 2016-01-01: 2 mg via INTRAVENOUS

## 2016-01-01 MED ORDER — LACTATED RINGERS IV SOLN
INTRAVENOUS | Status: DC
  Administered 2016-01-01: 11:00:00 via INTRAVENOUS

## 2016-01-01 MED ORDER — LIDOCAINE HCL 3.5 % OP GEL
1.0000 "application " | Freq: Once | OPHTHALMIC | Status: AC
  Administered 2016-01-01: 1 via OPHTHALMIC

## 2016-01-01 MED ORDER — MIDAZOLAM HCL 2 MG/2ML IJ SOLN
INTRAMUSCULAR | Status: AC
  Filled 2016-01-01: qty 2

## 2016-01-01 MED ORDER — EPINEPHRINE HCL 1 MG/ML IJ SOLN
INTRAMUSCULAR | Status: AC
  Filled 2016-01-01: qty 1

## 2016-01-01 MED ORDER — TETRACAINE HCL 0.5 % OP SOLN
1.0000 [drp] | OPHTHALMIC | Status: AC
  Administered 2016-01-01 (×3): 1 [drp] via OPHTHALMIC

## 2016-01-01 MED ORDER — POVIDONE-IODINE 5 % OP SOLN
OPHTHALMIC | Status: DC | PRN
  Administered 2016-01-01: 1 via OPHTHALMIC

## 2016-01-01 MED ORDER — EPINEPHRINE HCL 1 MG/ML IJ SOLN
INTRAMUSCULAR | Status: DC | PRN
  Administered 2016-01-01: 500 mL

## 2016-01-01 MED ORDER — PHENYLEPHRINE HCL 2.5 % OP SOLN
1.0000 [drp] | OPHTHALMIC | Status: AC
  Administered 2016-01-01 (×3): 1 [drp] via OPHTHALMIC

## 2016-01-01 MED ORDER — LIDOCAINE HCL 3.5 % OP GEL
OPHTHALMIC | Status: AC
  Filled 2016-01-01: qty 1

## 2016-01-01 MED ORDER — PROVISC 10 MG/ML IO SOLN
INTRAOCULAR | Status: DC | PRN
  Administered 2016-01-01: 0.85 mL via INTRAOCULAR

## 2016-01-01 MED ORDER — NEOMYCIN-POLYMYXIN-DEXAMETH 3.5-10000-0.1 OP SUSP
OPHTHALMIC | Status: DC | PRN
  Administered 2016-01-01: 2 [drp] via OPHTHALMIC

## 2016-01-01 MED ORDER — PHENYLEPHRINE HCL 2.5 % OP SOLN
OPHTHALMIC | Status: AC
  Filled 2016-01-01: qty 15

## 2016-01-01 MED ORDER — LIDOCAINE HCL (PF) 1 % IJ SOLN
INTRAMUSCULAR | Status: AC
  Filled 2016-01-01: qty 2

## 2016-01-01 MED ORDER — TETRACAINE 0.5 % OP SOLN OPTIME - NO CHARGE
OPHTHALMIC | Status: DC | PRN
  Administered 2016-01-01: 2 [drp] via OPHTHALMIC

## 2016-01-01 MED ORDER — BSS IO SOLN
INTRAOCULAR | Status: DC | PRN
  Administered 2016-01-01: 15 mL

## 2016-01-01 MED ORDER — NEOMYCIN-POLYMYXIN-DEXAMETH 3.5-10000-0.1 OP SUSP
OPHTHALMIC | Status: AC
  Filled 2016-01-01: qty 5

## 2016-01-01 MED ORDER — CYCLOPENTOLATE-PHENYLEPHRINE 0.2-1 % OP SOLN
1.0000 [drp] | OPHTHALMIC | Status: AC
  Administered 2016-01-01 (×3): 1 [drp] via OPHTHALMIC

## 2016-01-01 MED ORDER — FENTANYL CITRATE (PF) 100 MCG/2ML IJ SOLN
25.0000 ug | INTRAMUSCULAR | Status: AC
  Administered 2016-01-01: 25 ug via INTRAVENOUS

## 2016-01-01 SURGICAL SUPPLY — 23 items
CAPSULAR TENSION RING-AMO (OPHTHALMIC RELATED) IMPLANT
CLOTH BEACON ORANGE TIMEOUT ST (SAFETY) ×1 IMPLANT
EYE SHIELD UNIVERSAL CLEAR (GAUZE/BANDAGES/DRESSINGS) ×1 IMPLANT
GLOVE BIOGEL PI IND STRL 7.0 (GLOVE) IMPLANT
GLOVE BIOGEL PI IND STRL 7.5 (GLOVE) IMPLANT
GLOVE BIOGEL PI INDICATOR 7.0 (GLOVE) ×1
GLOVE BIOGEL PI INDICATOR 7.5 (GLOVE)
GLOVE EXAM NITRILE LRG STRL (GLOVE) IMPLANT
GLOVE EXAM NITRILE MD LF STRL (GLOVE) ×1 IMPLANT
KIT VITRECTOMY (OPHTHALMIC RELATED) IMPLANT
LENS IOL ACRYSOF IQ TORIC 15.0 ×1 IMPLANT
PAD ARMBOARD 7.5X6 YLW CONV (MISCELLANEOUS) ×1 IMPLANT
PROC W NO LENS (INTRAOCULAR LENS)
PROC W SPEC LENS (INTRAOCULAR LENS) ×2
PROCESS W NO LENS (INTRAOCULAR LENS) IMPLANT
PROCESS W SPEC LENS (INTRAOCULAR LENS) IMPLANT
RETRACTOR IRIS SIGHTPATH (OPHTHALMIC RELATED) IMPLANT
RING MALYGIN (MISCELLANEOUS) IMPLANT
SYRINGE LUER LOK 1CC (MISCELLANEOUS) ×1 IMPLANT
TAPE SURG TRANSPORE 1 IN (GAUZE/BANDAGES/DRESSINGS) IMPLANT
TAPE SURGICAL TRANSPORE 1 IN (GAUZE/BANDAGES/DRESSINGS) ×1
VISCOELASTIC ADDITIONAL (OPHTHALMIC RELATED) IMPLANT
WATER STERILE IRR 250ML POUR (IV SOLUTION) ×1 IMPLANT

## 2016-01-01 NOTE — Addendum Note (Signed)
Addendum  created 01/01/16 1201 by Vista Deck, CRNA   Modules edited: Anesthesia Flowsheet

## 2016-01-01 NOTE — Anesthesia Preprocedure Evaluation (Signed)
Anesthesia Evaluation  Patient identified by MRN, date of birth, ID band Patient awake    Reviewed: Allergy & Precautions, H&P , NPO status , Patient's Chart, lab work & pertinent test results, reviewed documented beta blocker date and time   Airway Mallampati: II  TM Distance: >3 FB Neck ROM: Full    Dental  (+) Implants   Pulmonary neg pulmonary ROS, asthma , former smoker,    Pulmonary exam normal        Cardiovascular hypertension, Pt. on medications + CAD   Rhythm:Regular Rate:Normal     Neuro/Psych  Neuromuscular disease    GI/Hepatic   Endo/Other    Renal/GU      Musculoskeletal  (+) Arthritis , Rheumatoid disorders,  Fibromyalgia -  Abdominal   Peds  Hematology   Anesthesia Other Findings   Reproductive/Obstetrics                             Anesthesia Physical Anesthesia Plan  ASA: III  Anesthesia Plan: MAC   Post-op Pain Management:    Induction: Intravenous  Airway Management Planned: Nasal Cannula  Additional Equipment:   Intra-op Plan:   Post-operative Plan:   Informed Consent: I have reviewed the patients History and Physical, chart, labs and discussed the procedure including the risks, benefits and alternatives for the proposed anesthesia with the patient or authorized representative who has indicated his/her understanding and acceptance.     Plan Discussed with:   Anesthesia Plan Comments:         Anesthesia Quick Evaluation

## 2016-01-01 NOTE — Discharge Instructions (Signed)

## 2016-01-01 NOTE — H&P (Signed)
I have reviewed the H&P, the patient was re-examined, and I have identified no interval changes in medical condition and plan of care since the history and physical of record  

## 2016-01-01 NOTE — Anesthesia Procedure Notes (Signed)
Procedure Name: MAC Date/Time: 01/01/2016 11:19 AM Performed by: Vista Deck Pre-anesthesia Checklist: Patient identified, Emergency Drugs available, Suction available, Timeout performed and Patient being monitored Patient Re-evaluated:Patient Re-evaluated prior to inductionOxygen Delivery Method: Nasal Cannula

## 2016-01-01 NOTE — Transfer of Care (Signed)
Immediate Anesthesia Transfer of Care Note  Patient: [name]  Procedure(s) Performed: Procedure(s) (LRB): CATARACT EXTRACTION PHACO AND INTRAOCULAR LENS PLACEMENT (IOC) (Left)  Patient Location: Shortstay  Anesthesia Type: MAC  Level of Consciousness: awake  Airway & Oxygen Therapy: Patient Spontanous Breathing   Post-op Assessment: Report given to PACU RN, Post -op Vital signs reviewed and stable and Patient moving all extremities  Post vital signs: Reviewed and stable  Complications: No apparent anesthesia complications

## 2016-01-01 NOTE — Anesthesia Postprocedure Evaluation (Signed)
Anesthesia Post Note  Patient: [name]  Procedure(s) Performed: Procedure(s) (LRB): CATARACT EXTRACTION PHACO AND INTRAOCULAR LENS PLACEMENT (IOC) (Left)  Patient location during evaluation: Short Stay Anesthesia Type: MAC Level of consciousness: awake and alert Pain management: pain level controlled Vital Signs Assessment: post-procedure vital signs reviewed and stable Respiratory status: spontaneous breathing Cardiovascular status: stable Postop Assessment: no signs of nausea or vomiting    Last Vitals:  Filed Vitals:   01/01/16 1110 01/01/16 1115  BP: 156/84   Resp: 16 15    Last Pain: There were no vitals filed for this visit.               Drucie Opitz

## 2016-01-01 NOTE — Op Note (Signed)
Date of Admission: 01/01/2016  Date of Surgery: 01/01/2016  Pre-Op Dx: Cataract Left Eye  Post-Op Dx: Senile Combined Cataract Left Eye,  Dx Code H25.812, Astigmatism Left Eye, Dx Code H52.2  Surgeon: Tonny Branch, M.D.  Assistants: None  Anesthesia: Topical with MAC  Indications: Painless, progressive loss of vision with compromise of daily activities.  Surgery: Cataract Extraction with Intraocular lens Implant Left Eye  Discription: The patient had dilating drops and viscous lidocaine placed into the Left in the pre-op holding area. In the sitting position horizontal reference marks were made on the cornea.  After transfer to the operating room, a time out was performed. The patient was then prepped and draped. Beginning with a 82 degree blade a paracentesis port was made at the surgeon's 2 o'clock position. The anterior chamber was then filled with 1% non-preserved lidocaine. This was followed by filling the anterior chamber with Provisc. A 2.17mm keratome blade was used to make a clear cornea incision at the temporal limbus. A bent cystatome needle was used to create a continuous tear capsulotomy. Hydrodissection was performed with balanced salt solution on a Fine canula. The lens nucleus was then removed using the phacoemulsification handpiece. Residual cortex was removed with the I&A handpiece. The anterior chamber and capsular bag were refilled with Provisc. A posterior chamber intraocular lens was placed into the capsular bag with it's injector. Additional corneal marks were made on the 78/258 degree meridians.  The Provisc was then removed from the anterior chamber and capsular bag with the I&A handpiece. The implant was positioned with the Kuglan hook. Stromal hydration of the main incision and paracentesis port was performed with BSS on a Fine canula. The wounds were tested for leak which was negative. The patient tolerated the procedure well. There were no operative complications. The  patient was then transferred to the recovery room in stable condition.  Complications: None  Specimen: None  EBL: None  Prosthetic device: [employer], ZCT150, power18.5,  SN [id number].

## 2016-01-02 ENCOUNTER — Encounter (HOSPITAL_COMMUNITY): Payer: Self-pay | Admitting: Ophthalmology

## 2016-01-22 ENCOUNTER — Other Ambulatory Visit (HOSPITAL_COMMUNITY): Payer: Medicare Other

## 2016-02-10 ENCOUNTER — Encounter (HOSPITAL_COMMUNITY)
Admission: RE | Admit: 2016-02-10 | Discharge: 2016-02-10 | Disposition: A | Discharge status: Home / Self Care | Payer: Medicare Other | Source: Ambulatory Visit | Attending: Ophthalmology | Admitting: Ophthalmology

## 2016-02-10 ENCOUNTER — Encounter (HOSPITAL_COMMUNITY): Payer: Self-pay

## 2016-02-16 ENCOUNTER — Encounter (HOSPITAL_COMMUNITY): Payer: Self-pay | Admitting: Anesthesiology

## 2016-02-16 ENCOUNTER — Ambulatory Visit (HOSPITAL_COMMUNITY): Payer: Medicare Other | Admitting: Anesthesiology

## 2016-02-16 ENCOUNTER — Encounter (HOSPITAL_COMMUNITY)
Admission: RE | Disposition: A | Discharge status: Home / Self Care | Payer: Self-pay | Source: Ambulatory Visit | Attending: Ophthalmology

## 2016-02-16 ENCOUNTER — Ambulatory Visit (HOSPITAL_COMMUNITY)
Admission: RE | Admit: 2016-02-16 | Discharge: 2016-02-16 | Disposition: A | Discharge status: Home / Self Care | Payer: Medicare Other | Source: Ambulatory Visit | Attending: Ophthalmology | Admitting: Ophthalmology

## 2016-02-16 DIAGNOSIS — J45909 Unspecified asthma, uncomplicated: Secondary | ICD-10-CM | POA: Insufficient documentation

## 2016-02-16 DIAGNOSIS — Z8543 Personal history of malignant neoplasm of ovary: Secondary | ICD-10-CM | POA: Insufficient documentation

## 2016-02-16 DIAGNOSIS — E7800 Pure hypercholesterolemia, unspecified: Secondary | ICD-10-CM | POA: Diagnosis not present

## 2016-02-16 DIAGNOSIS — H52201 Unspecified astigmatism, right eye: Secondary | ICD-10-CM | POA: Diagnosis not present

## 2016-02-16 DIAGNOSIS — Z8551 Personal history of malignant neoplasm of bladder: Secondary | ICD-10-CM | POA: Insufficient documentation

## 2016-02-16 DIAGNOSIS — Z7982 Long term (current) use of aspirin: Secondary | ICD-10-CM | POA: Diagnosis not present

## 2016-02-16 DIAGNOSIS — Z951 Presence of aortocoronary bypass graft: Secondary | ICD-10-CM | POA: Insufficient documentation

## 2016-02-16 DIAGNOSIS — Z85038 Personal history of other malignant neoplasm of large intestine: Secondary | ICD-10-CM | POA: Insufficient documentation

## 2016-02-16 DIAGNOSIS — H25811 Combined forms of age-related cataract, right eye: Secondary | ICD-10-CM | POA: Insufficient documentation

## 2016-02-16 DIAGNOSIS — Z87891 Personal history of nicotine dependence: Secondary | ICD-10-CM | POA: Insufficient documentation

## 2016-02-16 DIAGNOSIS — H269 Unspecified cataract: Secondary | ICD-10-CM | POA: Diagnosis present

## 2016-02-16 DIAGNOSIS — Z79899 Other long term (current) drug therapy: Secondary | ICD-10-CM | POA: Diagnosis not present

## 2016-02-16 DIAGNOSIS — M1990 Unspecified osteoarthritis, unspecified site: Secondary | ICD-10-CM | POA: Diagnosis not present

## 2016-02-16 DIAGNOSIS — M797 Fibromyalgia: Secondary | ICD-10-CM | POA: Insufficient documentation

## 2016-02-16 DIAGNOSIS — I10 Essential (primary) hypertension: Secondary | ICD-10-CM | POA: Diagnosis not present

## 2016-02-16 HISTORY — PX: CATARACT EXTRACTION W/PHACO: SHX586

## 2016-02-16 SURGERY — PHACOEMULSIFICATION, CATARACT, WITH IOL INSERTION
Anesthesia: Monitor Anesthesia Care | Site: Eye | Laterality: Right

## 2016-02-16 MED ORDER — PROVISC 10 MG/ML IO SOLN
INTRAOCULAR | Status: DC | PRN
  Administered 2016-02-16: 0.85 mL via INTRAOCULAR

## 2016-02-16 MED ORDER — LIDOCAINE 3.5 % OP GEL OPTIME - NO CHARGE
OPHTHALMIC | Status: DC | PRN
  Administered 2016-02-16: 1 [drp] via OPHTHALMIC

## 2016-02-16 MED ORDER — FENTANYL CITRATE (PF) 100 MCG/2ML IJ SOLN
INTRAMUSCULAR | Status: AC
  Filled 2016-02-16: qty 2

## 2016-02-16 MED ORDER — LACTATED RINGERS IV SOLN
INTRAVENOUS | Status: DC
  Administered 2016-02-16: 1000 mL via INTRAVENOUS

## 2016-02-16 MED ORDER — PHENYLEPHRINE HCL 2.5 % OP SOLN
1.0000 [drp] | OPHTHALMIC | Status: AC
  Administered 2016-02-16 (×3): 1 [drp] via OPHTHALMIC

## 2016-02-16 MED ORDER — FENTANYL CITRATE (PF) 100 MCG/2ML IJ SOLN
25.0000 ug | INTRAMUSCULAR | Status: AC | PRN
Start: 2016-02-16 — End: 2016-02-16
  Administered 2016-02-16 (×2): 25 ug via INTRAVENOUS

## 2016-02-16 MED ORDER — CYCLOPENTOLATE-PHENYLEPHRINE 0.2-1 % OP SOLN
1.0000 [drp] | OPHTHALMIC | Status: AC
  Administered 2016-02-16 (×3): 1 [drp] via OPHTHALMIC

## 2016-02-16 MED ORDER — LIDOCAINE HCL 3.5 % OP GEL
1.0000 "application " | Freq: Once | OPHTHALMIC | Status: AC
  Administered 2016-02-16: 1 via OPHTHALMIC

## 2016-02-16 MED ORDER — BSS IO SOLN
INTRAOCULAR | Status: DC | PRN
  Administered 2016-02-16: 500 mL

## 2016-02-16 MED ORDER — BSS IO SOLN
INTRAOCULAR | Status: DC | PRN
  Administered 2016-02-16: 15 mL via INTRAOCULAR

## 2016-02-16 MED ORDER — MIDAZOLAM HCL 2 MG/2ML IJ SOLN
INTRAMUSCULAR | Status: AC
  Filled 2016-02-16: qty 2

## 2016-02-16 MED ORDER — NEOMYCIN-POLYMYXIN-DEXAMETH 3.5-10000-0.1 OP SUSP
OPHTHALMIC | Status: DC | PRN
  Administered 2016-02-16: 1 [drp] via OPHTHALMIC

## 2016-02-16 MED ORDER — LIDOCAINE HCL (PF) 1 % IJ SOLN
INTRAMUSCULAR | Status: DC | PRN
  Administered 2016-02-16: .4 mL

## 2016-02-16 MED ORDER — MIDAZOLAM HCL 2 MG/2ML IJ SOLN
1.0000 mg | INTRAMUSCULAR | Status: DC | PRN
  Administered 2016-02-16: 2 mg via INTRAVENOUS

## 2016-02-16 MED ORDER — POVIDONE-IODINE 5 % OP SOLN
OPHTHALMIC | Status: DC | PRN
  Administered 2016-02-16: 1 via OPHTHALMIC

## 2016-02-16 MED ORDER — TETRACAINE HCL 0.5 % OP SOLN
1.0000 [drp] | OPHTHALMIC | Status: AC
  Administered 2016-02-16 (×3): 1 [drp] via OPHTHALMIC

## 2016-02-16 MED ORDER — EPINEPHRINE HCL 1 MG/ML IJ SOLN
INTRAMUSCULAR | Status: AC
  Filled 2016-02-16: qty 1

## 2016-02-16 SURGICAL SUPPLY — 23 items
CAPSULAR TENSION RING-AMO (OPHTHALMIC RELATED) IMPLANT
CLOTH BEACON ORANGE TIMEOUT ST (SAFETY) ×1 IMPLANT
EYE SHIELD UNIVERSAL CLEAR (GAUZE/BANDAGES/DRESSINGS) ×1 IMPLANT
GLOVE BIOGEL PI IND STRL 7.0 (GLOVE) IMPLANT
GLOVE BIOGEL PI IND STRL 7.5 (GLOVE) IMPLANT
GLOVE BIOGEL PI INDICATOR 7.0 (GLOVE) ×1
GLOVE BIOGEL PI INDICATOR 7.5 (GLOVE)
GLOVE EXAM NITRILE LRG STRL (GLOVE) IMPLANT
GLOVE EXAM NITRILE MD LF STRL (GLOVE) ×1 IMPLANT
KIT VITRECTOMY (OPHTHALMIC RELATED) IMPLANT
LENS IOL ACRYSOF IQ TORIC 15.0 ×1 IMPLANT
PAD ARMBOARD 7.5X6 YLW CONV (MISCELLANEOUS) ×1 IMPLANT
PROC W NO LENS (INTRAOCULAR LENS)
PROC W SPEC LENS (INTRAOCULAR LENS) ×2
PROCESS W NO LENS (INTRAOCULAR LENS) IMPLANT
PROCESS W SPEC LENS (INTRAOCULAR LENS) IMPLANT
RETRACTOR IRIS SIGHTPATH (OPHTHALMIC RELATED) IMPLANT
RING MALYGIN (MISCELLANEOUS) IMPLANT
SYRINGE LUER LOK 1CC (MISCELLANEOUS) ×1 IMPLANT
TAPE SURG TRANSPORE 1 IN (GAUZE/BANDAGES/DRESSINGS) IMPLANT
TAPE SURGICAL TRANSPORE 1 IN (GAUZE/BANDAGES/DRESSINGS) ×1
VISCOELASTIC ADDITIONAL (OPHTHALMIC RELATED) IMPLANT
WATER STERILE IRR 250ML POUR (IV SOLUTION) ×1 IMPLANT

## 2016-02-16 NOTE — Anesthesia Postprocedure Evaluation (Signed)
Anesthesia Post Note  Patient: [name]  Procedure(s) Performed: Procedure(s) (LRB): CATARACT EXTRACTION PHACO AND INTRAOCULAR LENS PLACEMENT RIGHT EYE; CDE:  7.08 (Right)  Patient location during evaluation: PACU Anesthesia Type: MAC Level of consciousness: awake and alert Pain management: pain level controlled Vital Signs Assessment: post-procedure vital signs reviewed and stable Respiratory status: spontaneous breathing Cardiovascular status: blood pressure returned to baseline Postop Assessment: adequate PO intake    Last Vitals:  Filed Vitals:   02/16/16 0720 02/16/16 0725  BP: 109/66 123/75  Pulse:    Temp:    Resp: 15 16    Last Pain: There were no vitals filed for this visit.               Yale Golla

## 2016-02-16 NOTE — Discharge Instructions (Signed)
Anesthesia, Adult, Care After °Refer to this sheet in the next few weeks. These instructions provide you with information on caring for yourself after your procedure. Your health care provider may also give you more specific instructions. Your treatment has been planned according to current medical practices, but problems sometimes occur. Call your health care provider if you have any problems or questions after your procedure. °WHAT TO EXPECT AFTER THE PROCEDURE °After the procedure, it is typical to experience: °· Sleepiness. °· Nausea and vomiting. °HOME CARE INSTRUCTIONS °· For the first 24 hours after general anesthesia: °¨ Have a responsible person with you. °¨ Do not drive a car. If you are alone, do not take public transportation. °¨ Do not drink alcohol. °¨ Do not take medicine that has not been prescribed by your health care provider. °¨ Do not sign important papers or make important decisions. °¨ You may resume a normal diet and activities as directed by your health care provider. °· Change bandages (dressings) as directed. °· If you have questions or problems that seem related to general anesthesia, call the hospital and ask for the anesthetist or anesthesiologist on call. °SEEK MEDICAL CARE IF: °· You have nausea and vomiting that continue the day after anesthesia. °· You develop a rash. °SEEK IMMEDIATE MEDICAL CARE IF:  °· You have difficulty breathing. °· You have chest pain. °· You have any allergic problems. °  °This information is not intended to replace advice given to you by your health care provider. Make sure you discuss any questions you have with your health care provider. °  °Document Released: 11/29/2000 Document Revised: 09/13/2014 Document Reviewed: 12/22/2011 °Elsevier Interactive Patient Education ©2016 Elsevier Inc. ° °

## 2016-02-16 NOTE — Transfer of Care (Signed)
Immediate Anesthesia Transfer of Care Note  Patient: [name]  Procedure(s) Performed: Procedure(s): CATARACT EXTRACTION PHACO AND INTRAOCULAR LENS PLACEMENT RIGHT EYE; CDE:  7.08 (Right)  Patient Location: PACU  Anesthesia Type:MAC  Level of Consciousness: awake  Airway & Oxygen Therapy: Patient Spontanous Breathing  Post-op Assessment: Report given to RN  Post vital signs: Reviewed  Last Vitals:  Filed Vitals:   02/16/16 0720 02/16/16 0725  BP: 109/66 123/75  Pulse:    Temp:    Resp: 15 16    Last Pain: There were no vitals filed for this visit.       Complications: No apparent anesthesia complications

## 2016-02-16 NOTE — Anesthesia Preprocedure Evaluation (Signed)
Anesthesia Evaluation  Patient identified by MRN, date of birth, ID band Patient awake    Reviewed: Allergy & Precautions, H&P , NPO status , Patient's Chart, lab work & pertinent test results, reviewed documented beta blocker date and time   Airway Mallampati: II  TM Distance: >3 FB Neck ROM: Full    Dental  (+) Implants   Pulmonary neg pulmonary ROS, asthma , former smoker,    Pulmonary exam normal        Cardiovascular hypertension, Pt. on medications + CAD   Rhythm:Regular Rate:Normal     Neuro/Psych  Neuromuscular disease    GI/Hepatic   Endo/Other    Renal/GU      Musculoskeletal  (+) Arthritis , Rheumatoid disorders,  Fibromyalgia -  Abdominal   Peds  Hematology   Anesthesia Other Findings   Reproductive/Obstetrics                             Anesthesia Physical Anesthesia Plan  ASA: III  Anesthesia Plan: MAC   Post-op Pain Management:    Induction: Intravenous  Airway Management Planned: Nasal Cannula  Additional Equipment:   Intra-op Plan:   Post-operative Plan:   Informed Consent: I have reviewed the patients History and Physical, chart, labs and discussed the procedure including the risks, benefits and alternatives for the proposed anesthesia with the patient or authorized representative who has indicated his/her understanding and acceptance.     Plan Discussed with:   Anesthesia Plan Comments:         Anesthesia Quick Evaluation

## 2016-02-16 NOTE — Op Note (Signed)
Date of Admission: 02/16/2016  Date of Surgery: 02/16/2016  Pre-Op Dx: Cataract Right Eye  Post-Op Dx: Senile Combined Cataract Right Eye,  Dx Code H25.811, Astigmatism Right Eye, Dx Code H52.2  Surgeon: Tonny Branch, M.D.  Assistants: None  Anesthesia: Topical with MAC  Indications: Painless, progressive loss of vision with compromise of daily activities.  Surgery: Cataract Extraction with Intraocular lens Implant Right Eye  Discription: The patient had dilating drops and viscous lidocaine placed into the Right in the pre-op holding area. In the sitting position horizontal reference marks were made on the cornea.  After transfer to the operating room, a time out was performed. The patient was then prepped and draped. Beginning with a 47 degree blade a paracentesis port was made at the surgeon's 2 o'clock position. The anterior chamber was then filled with 1% non-preserved lidocaine. This was followed by filling the anterior chamber with Provisc. A 2.60mm keratome blade was used to make a clear cornea incision at the temporal limbus. A bent cystatome needle was used to create a continuous tear capsulotomy. Hydrodissection was performed with balanced salt solution on a Fine canula. The lens nucleus was then removed using the phacoemulsification handpiece. Residual cortex was removed with the I&A handpiece. The anterior chamber and capsular bag were refilled with Provisc. A posterior chamber intraocular lens was placed into the capsular bag with it's injector. Additional corneal marks were made on the 80/260 degree meridians.  The Provisc was then removed from the anterior chamber and capsular bag with the I&A handpiece. The implant was positioned with the Kuglan hook. Stromal hydration of the main incision and paracentesis port was performed with BSS on a Fine canula. The wounds were tested for leak which was negative. The patient tolerated the procedure well. There were no operative complications. The  patient was then transferred to the recovery room in stable condition.  Complications: None  Specimen: None  EBL: None  Prosthetic device: [employer] ZCT150, power17.0,  SN [id number].

## 2016-02-16 NOTE — H&P (Signed)
I have reviewed the H&P, the patient was re-examined, and I have identified no interval changes in medical condition and plan of care since the history and physical of record  

## 2016-02-18 ENCOUNTER — Encounter (HOSPITAL_COMMUNITY): Payer: Self-pay | Admitting: Ophthalmology

## 2016-10-05 ENCOUNTER — Encounter (INDEPENDENT_AMBULATORY_CARE_PROVIDER_SITE_OTHER): Payer: Self-pay | Admitting: *Deleted
